# Patient Record
Sex: Male | Born: 1935 | Race: White | Hispanic: No | Marital: Married | Smoking: Former smoker
Health system: Southern US, Community
[De-identification: ages and names within clinical notes are randomized; demographics above are authoritative.]

## PROBLEM LIST (undated history)

## (undated) DIAGNOSIS — I2510 Atherosclerotic heart disease of native coronary artery without angina pectoris: Secondary | ICD-10-CM

## (undated) DIAGNOSIS — E785 Hyperlipidemia, unspecified: Secondary | ICD-10-CM

## (undated) DIAGNOSIS — Z8546 Personal history of malignant neoplasm of prostate: Secondary | ICD-10-CM

## (undated) DIAGNOSIS — I10 Essential (primary) hypertension: Secondary | ICD-10-CM

## (undated) DIAGNOSIS — R1030 Lower abdominal pain, unspecified: Secondary | ICD-10-CM

## (undated) DIAGNOSIS — Z951 Presence of aortocoronary bypass graft: Secondary | ICD-10-CM

## (undated) DIAGNOSIS — C61 Malignant neoplasm of prostate: Secondary | ICD-10-CM

## (undated) HISTORY — PX: RADIOACTIVE SEED IMPLANT: SHX5150

## (undated) HISTORY — PX: SHOULDER SURGERY: SHX246

## (undated) HISTORY — DX: Essential (primary) hypertension: I10

## (undated) HISTORY — DX: Presence of aortocoronary bypass graft: Z95.1

## (undated) HISTORY — DX: Hyperlipidemia, unspecified: E78.5

## (undated) HISTORY — DX: Lower abdominal pain, unspecified: R10.30

## (undated) HISTORY — DX: Personal history of malignant neoplasm of prostate: Z85.46

## (undated) HISTORY — DX: Atherosclerotic heart disease of native coronary artery without angina pectoris: I25.10

---

## 2004-02-29 ENCOUNTER — Ambulatory Visit: Payer: Self-pay | Admitting: Gastroenterology

## 2004-03-13 ENCOUNTER — Ambulatory Visit: Payer: Self-pay | Admitting: Gastroenterology

## 2004-03-29 ENCOUNTER — Ambulatory Visit: Payer: Self-pay | Admitting: Gastroenterology

## 2004-10-04 ENCOUNTER — Encounter (HOSPITAL_COMMUNITY): Admission: RE | Admit: 2004-10-04 | Discharge: 2004-12-26 | Payer: Self-pay | Admitting: Urology

## 2004-10-16 ENCOUNTER — Ambulatory Visit: Admission: RE | Admit: 2004-10-16 | Discharge: 2005-01-14 | Payer: Self-pay | Admitting: Urology

## 2005-01-15 ENCOUNTER — Ambulatory Visit: Admission: RE | Admit: 2005-01-15 | Discharge: 2005-03-14 | Payer: Self-pay | Admitting: Radiation Oncology

## 2005-09-06 ENCOUNTER — Ambulatory Visit (HOSPITAL_COMMUNITY): Admission: RE | Admit: 2005-09-06 | Discharge: 2005-09-07 | Payer: Self-pay | Admitting: Orthopedic Surgery

## 2006-01-11 ENCOUNTER — Ambulatory Visit (HOSPITAL_COMMUNITY): Admission: RE | Admit: 2006-01-11 | Discharge: 2006-01-11 | Payer: Self-pay | Admitting: Orthopedic Surgery

## 2006-03-05 ENCOUNTER — Encounter: Admission: RE | Admit: 2006-03-05 | Discharge: 2006-03-05 | Payer: Self-pay | Admitting: Otolaryngology

## 2006-12-31 ENCOUNTER — Ambulatory Visit: Admission: RE | Admit: 2006-12-31 | Discharge: 2007-01-01 | Payer: Self-pay | Admitting: Radiation Oncology

## 2006-12-31 LAB — PSA: PSA: 0.11 ng/mL (ref 0.10–4.00)

## 2008-01-06 ENCOUNTER — Ambulatory Visit: Admission: RE | Admit: 2008-01-06 | Discharge: 2008-01-06 | Payer: Self-pay | Admitting: Radiation Oncology

## 2008-01-06 LAB — PSA: PSA: 0.05 ng/mL — ABNORMAL LOW (ref 0.10–4.00)

## 2008-07-19 ENCOUNTER — Ambulatory Visit: Admission: RE | Admit: 2008-07-19 | Discharge: 2008-07-19 | Payer: Self-pay | Admitting: Radiation Oncology

## 2008-08-01 DIAGNOSIS — Z951 Presence of aortocoronary bypass graft: Secondary | ICD-10-CM

## 2008-08-01 HISTORY — PX: CORONARY ARTERY BYPASS GRAFT: SHX141

## 2008-08-01 HISTORY — DX: Presence of aortocoronary bypass graft: Z95.1

## 2008-08-06 ENCOUNTER — Encounter: Admission: RE | Admit: 2008-08-06 | Discharge: 2008-08-06 | Payer: Self-pay | Admitting: *Deleted

## 2008-08-10 ENCOUNTER — Ambulatory Visit: Payer: Self-pay | Admitting: Thoracic Surgery (Cardiothoracic Vascular Surgery)

## 2008-08-10 ENCOUNTER — Encounter: Payer: Self-pay | Admitting: Thoracic Surgery (Cardiothoracic Vascular Surgery)

## 2008-08-10 ENCOUNTER — Inpatient Hospital Stay (HOSPITAL_COMMUNITY): Admission: RE | Admit: 2008-08-10 | Discharge: 2008-08-17 | Payer: Self-pay | Admitting: *Deleted

## 2008-08-10 HISTORY — PX: CARDIAC CATHETERIZATION: SHX172

## 2008-09-02 ENCOUNTER — Encounter (HOSPITAL_COMMUNITY): Admission: RE | Admit: 2008-09-02 | Discharge: 2008-12-01 | Payer: Self-pay | Admitting: *Deleted

## 2008-09-13 ENCOUNTER — Encounter
Admission: RE | Admit: 2008-09-13 | Discharge: 2008-09-13 | Payer: Self-pay | Admitting: Thoracic Surgery (Cardiothoracic Vascular Surgery)

## 2008-09-13 ENCOUNTER — Ambulatory Visit: Payer: Self-pay | Admitting: Thoracic Surgery (Cardiothoracic Vascular Surgery)

## 2009-01-17 ENCOUNTER — Ambulatory Visit: Admission: RE | Admit: 2009-01-17 | Discharge: 2009-01-17 | Payer: Self-pay | Admitting: Radiation Oncology

## 2009-01-17 LAB — PSA: PSA: 0.03 ng/mL — ABNORMAL LOW (ref 0.10–4.00)

## 2009-11-22 ENCOUNTER — Ambulatory Visit: Payer: Self-pay | Admitting: Cardiology

## 2010-01-11 LAB — PSA: PSA: 0.03 ng/mL (ref <=4.00)

## 2010-02-03 IMAGING — CR DG CHEST 2V
2 series · 2 of 2 positions shown · non-contrast
Comparison: None

CLINICAL DATA: Pre cardiac catheterization, former smoker

CHEST - 2 VIEW

[w chest pa]
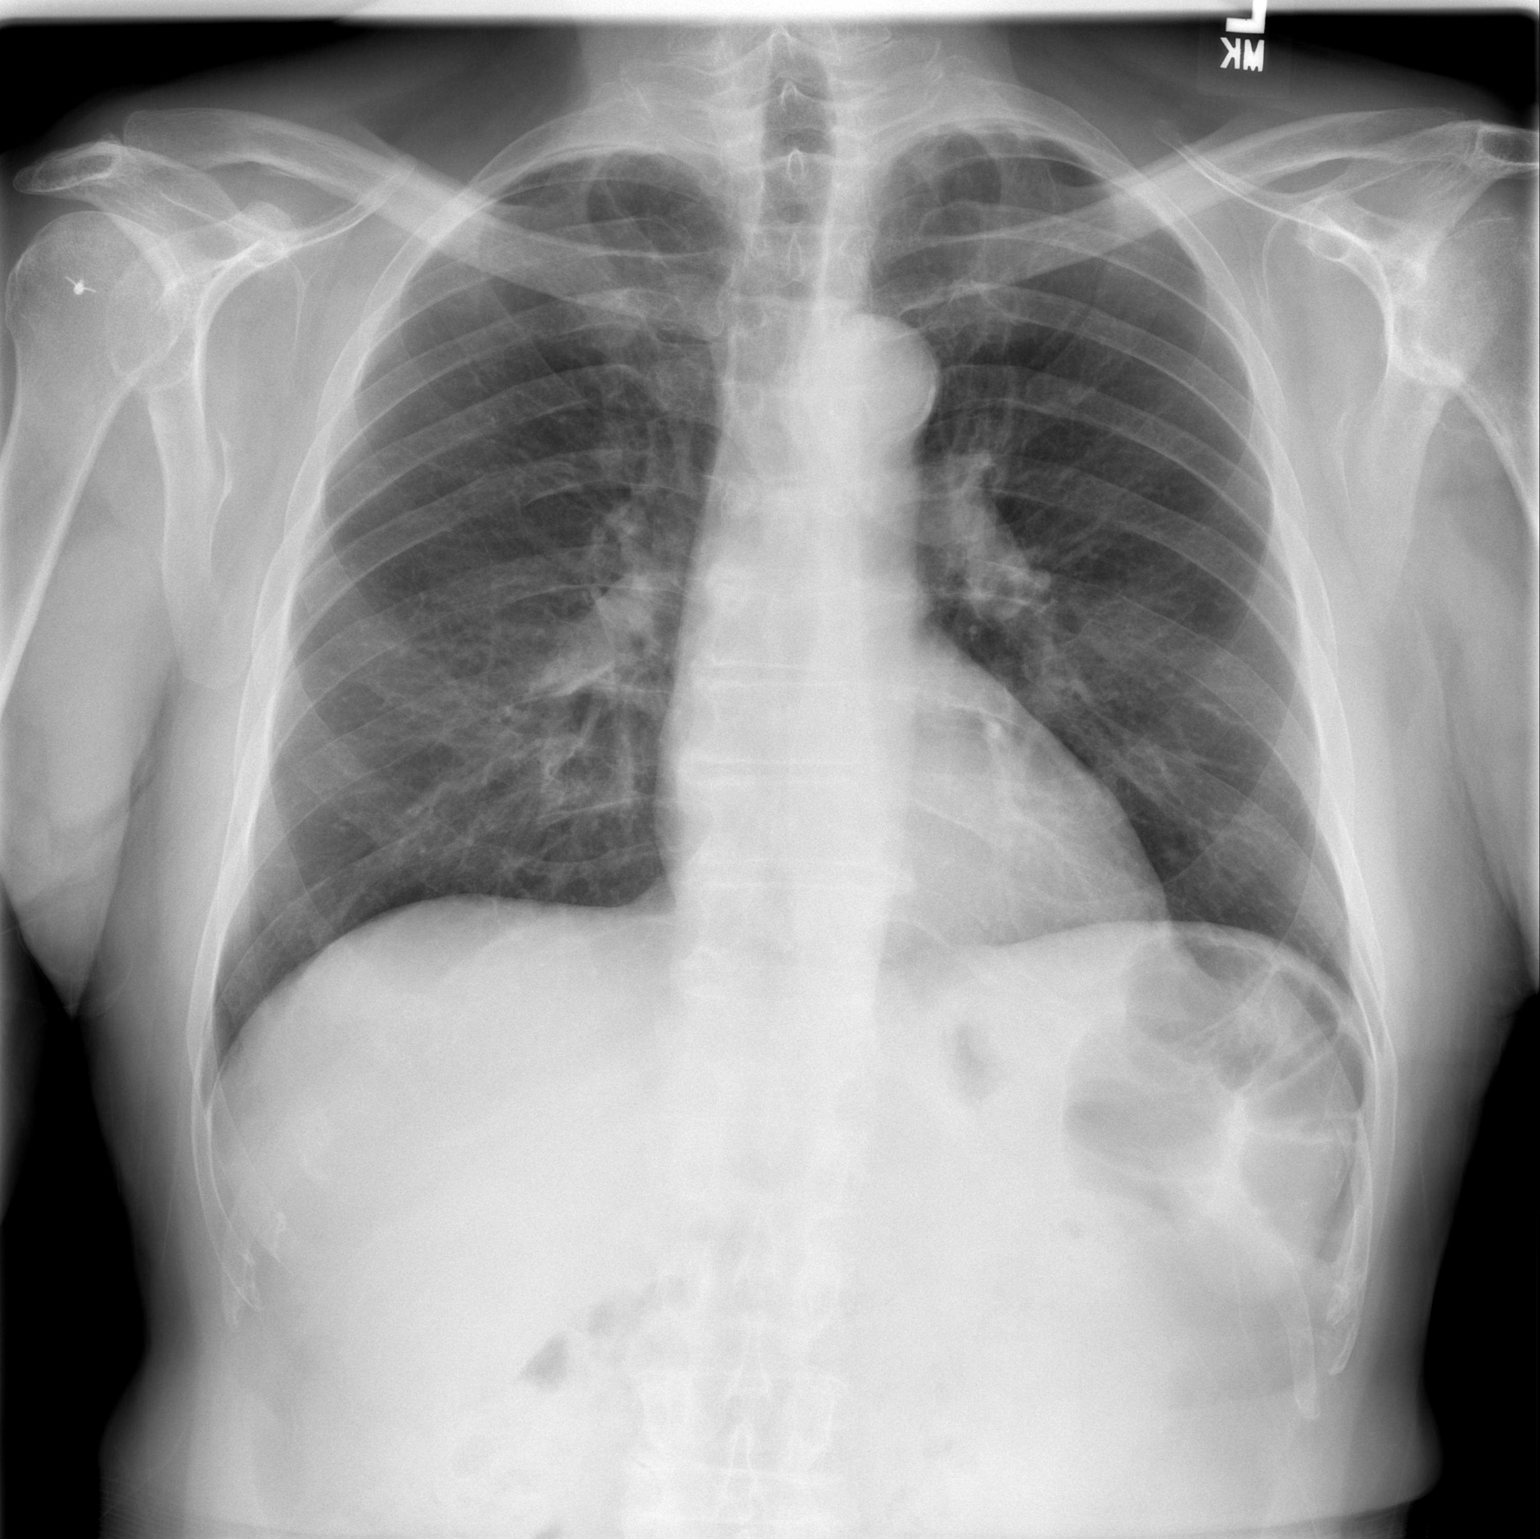

[w chest lat]
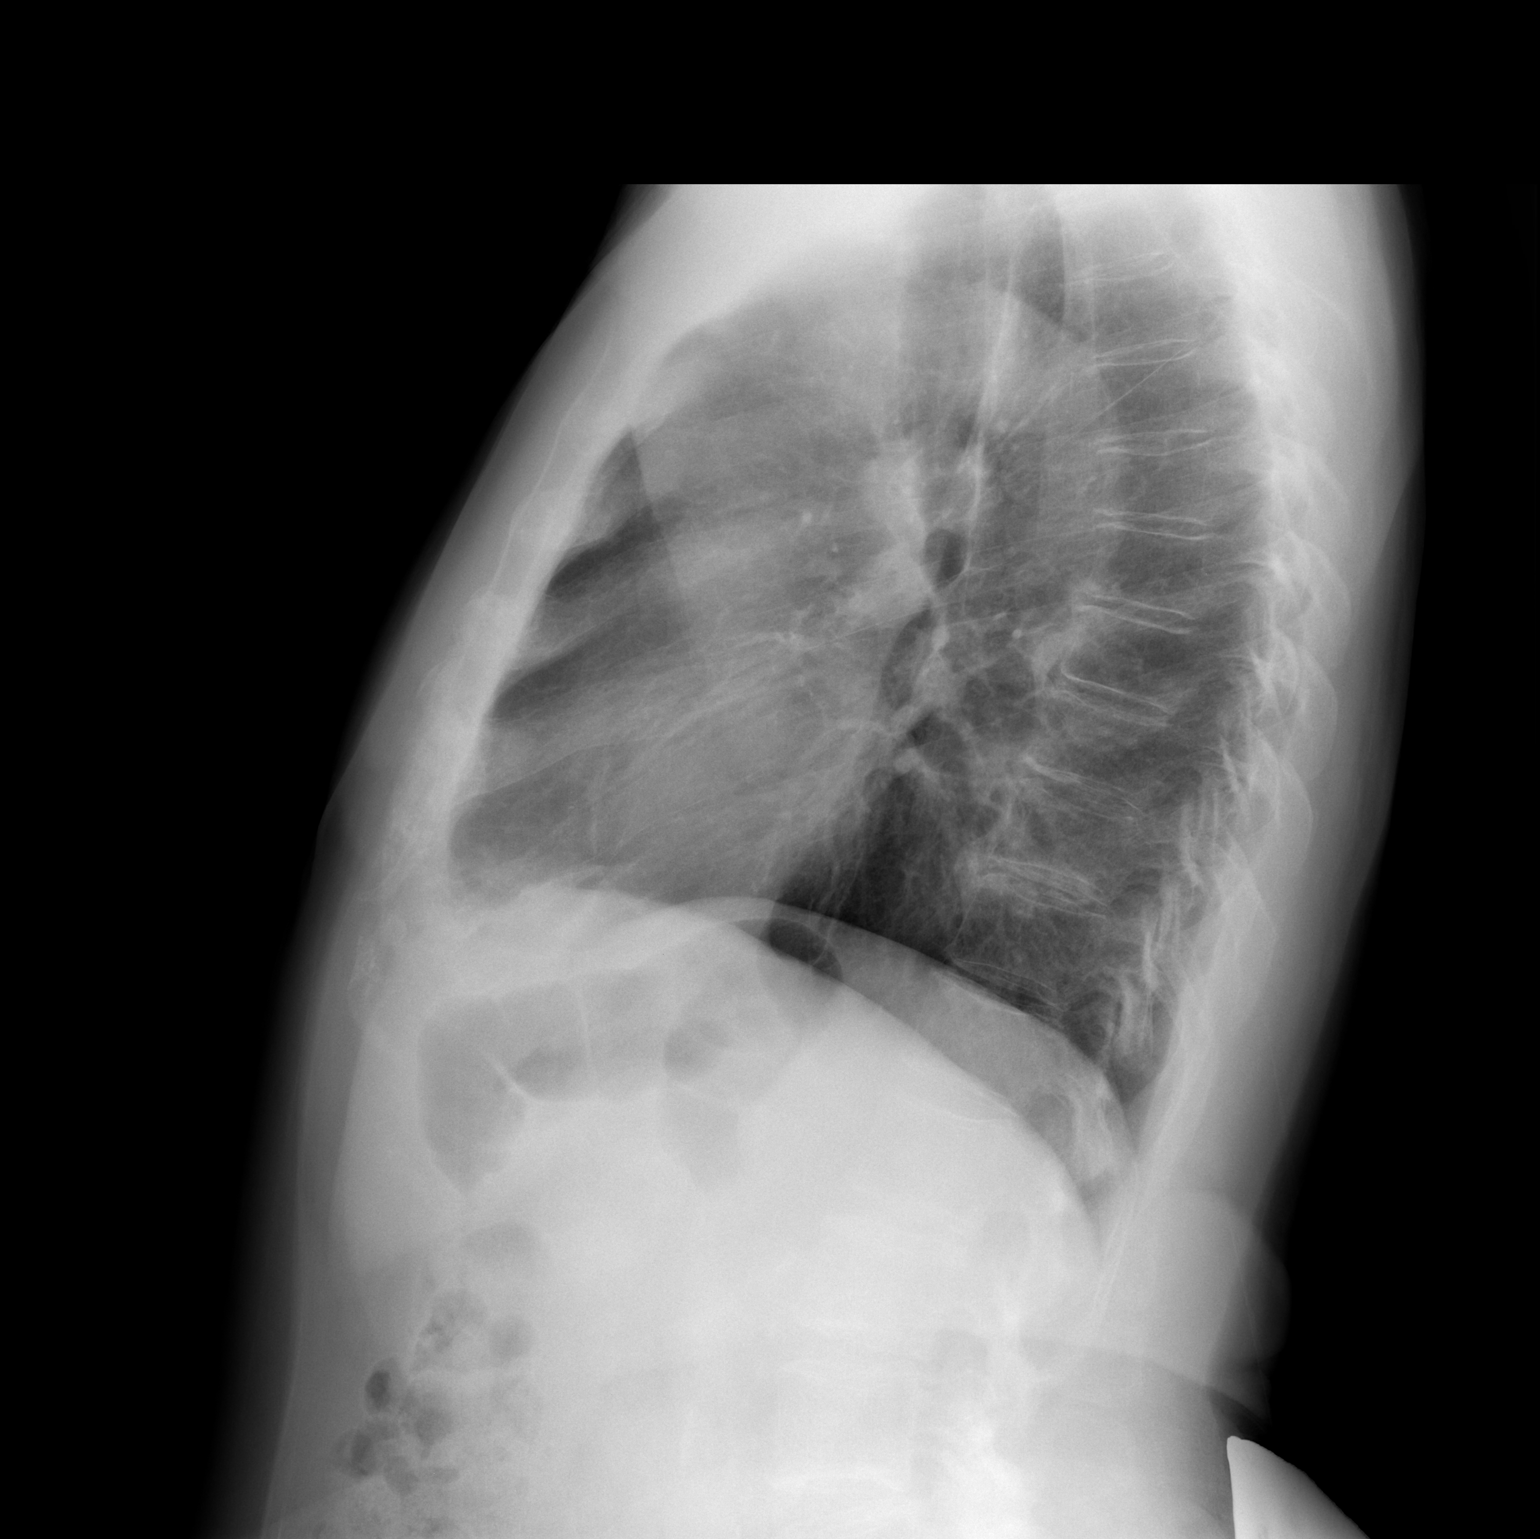

[2 of 2 positions shown; findings below may reference images not displayed]

FINDINGS: The lungs are clear.  The heart is within normal limits
in size.  No acute bony abnormalities seen.  There is mild
degenerative spurring in the mid and lower thoracic spine.
IMPRESSION: No active lung disease.

## 2010-03-13 IMAGING — CR DG CHEST 2V
2 series · 2 of 2 positions shown · non-contrast
Comparison: Chest x-ray of 08/15/2008

CLINICAL DATA: Post CABG, follow-up

CHEST - 2 VIEW

[w chest pa]
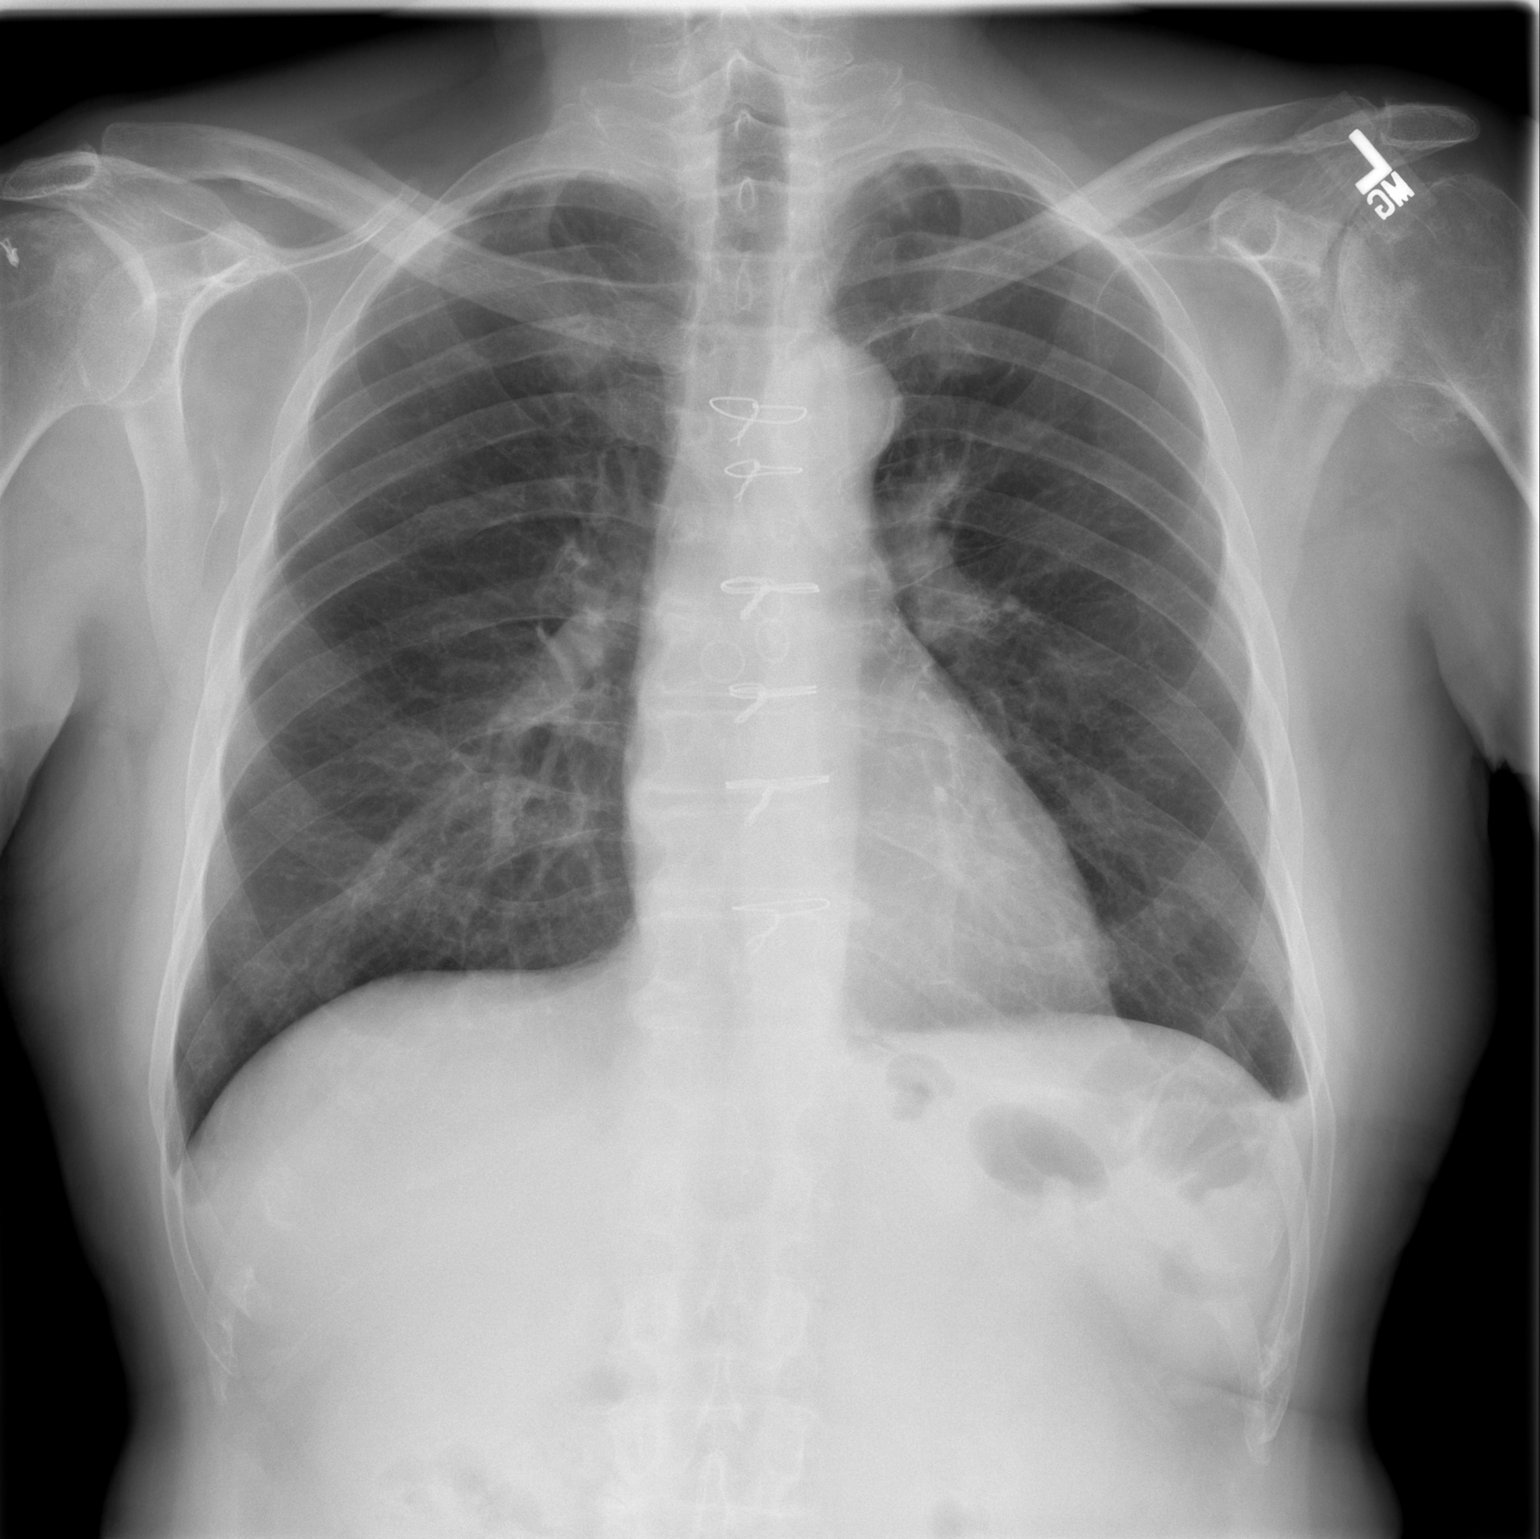

[w chest lat]
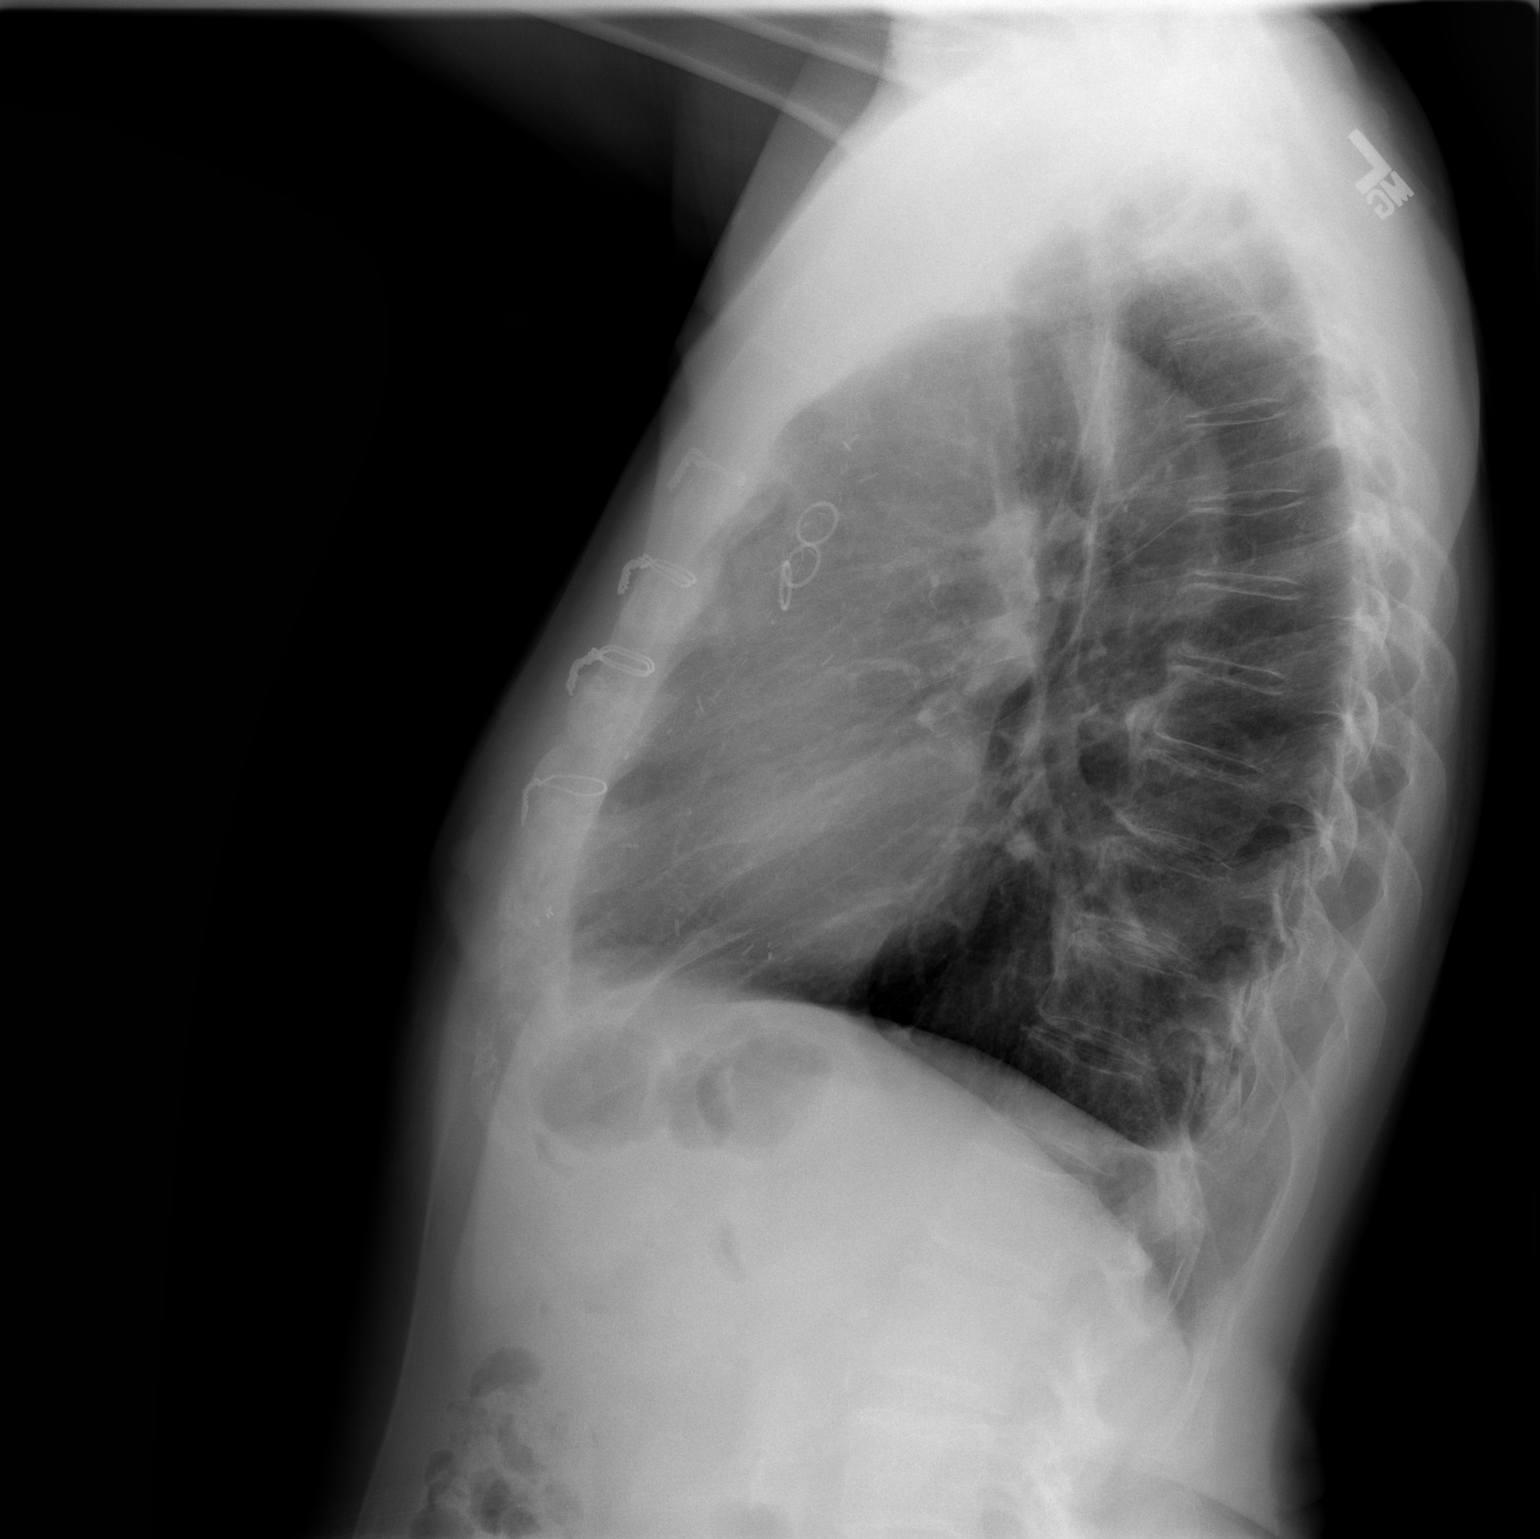

[2 of 2 positions shown; findings below may reference images not displayed]

FINDINGS: Only a small left pleural effusion remains.  Aeration has
improved with resolution of basilar atelectasis.  Heart size is
stable.  Median sternotomy sutures are noted.
IMPRESSION: Only a small left pleural effusion remains.

## 2010-04-08 LAB — CBC
HCT: 27.5 % — ABNORMAL LOW (ref 39.0–52.0)
Hemoglobin: 9.4 g/dL — ABNORMAL LOW (ref 13.0–17.0)
MCHC: 34 g/dL (ref 30.0–36.0)
MCV: 93.9 fL (ref 78.0–100.0)
Platelets: 149 10*3/uL — ABNORMAL LOW (ref 150–400)
RBC: 2.93 MIL/uL — ABNORMAL LOW (ref 4.22–5.81)
RDW: 14.4 % (ref 11.5–15.5)
WBC: 7.3 10*3/uL (ref 4.0–10.5)

## 2010-04-09 LAB — CBC
HCT: 24.6 % — ABNORMAL LOW (ref 39.0–52.0)
HCT: 25.2 % — ABNORMAL LOW (ref 39.0–52.0)
HCT: 25.9 % — ABNORMAL LOW (ref 39.0–52.0)
HCT: 36.7 % — ABNORMAL LOW (ref 39.0–52.0)
HCT: 40.7 % (ref 39.0–52.0)
HCT: 44.8 % (ref 39.0–52.0)
Hemoglobin: 12.6 g/dL — ABNORMAL LOW (ref 13.0–17.0)
Hemoglobin: 14.3 g/dL (ref 13.0–17.0)
Hemoglobin: 15.3 g/dL (ref 13.0–17.0)
Hemoglobin: 8.1 g/dL — ABNORMAL LOW (ref 13.0–17.0)
Hemoglobin: 8.5 g/dL — ABNORMAL LOW (ref 13.0–17.0)
Hemoglobin: 8.7 g/dL — ABNORMAL LOW (ref 13.0–17.0)
Hemoglobin: 9 g/dL — ABNORMAL LOW (ref 13.0–17.0)
Hemoglobin: 9.1 g/dL — ABNORMAL LOW (ref 13.0–17.0)
MCHC: 34.1 g/dL (ref 30.0–36.0)
MCHC: 34.3 g/dL (ref 30.0–36.0)
MCHC: 34.5 g/dL (ref 30.0–36.0)
MCHC: 34.6 g/dL (ref 30.0–36.0)
MCHC: 35 g/dL (ref 30.0–36.0)
MCHC: 35 g/dL (ref 30.0–36.0)
MCHC: 35.1 g/dL (ref 30.0–36.0)
MCHC: 35.6 g/dL (ref 30.0–36.0)
MCV: 90.6 fL (ref 78.0–100.0)
MCV: 91.7 fL (ref 78.0–100.0)
MCV: 91.9 fL (ref 78.0–100.0)
MCV: 92.1 fL (ref 78.0–100.0)
MCV: 92.6 fL (ref 78.0–100.0)
MCV: 93.6 fL (ref 78.0–100.0)
MCV: 94.2 fL (ref 78.0–100.0)
Platelets: 104 10*3/uL — ABNORMAL LOW (ref 150–400)
Platelets: 106 10*3/uL — ABNORMAL LOW (ref 150–400)
Platelets: 113 10*3/uL — ABNORMAL LOW (ref 150–400)
Platelets: 116 10*3/uL — ABNORMAL LOW (ref 150–400)
Platelets: 136 10*3/uL — ABNORMAL LOW (ref 150–400)
Platelets: 65 10*3/uL — ABNORMAL LOW (ref 150–400)
Platelets: 83 10*3/uL — ABNORMAL LOW (ref 150–400)
RBC: 2.39 MIL/uL — ABNORMAL LOW (ref 4.22–5.81)
RBC: 2.49 MIL/uL — ABNORMAL LOW (ref 4.22–5.81)
RBC: 2.66 MIL/uL — ABNORMAL LOW (ref 4.22–5.81)
RBC: 2.75 MIL/uL — ABNORMAL LOW (ref 4.22–5.81)
RBC: 2.81 MIL/uL — ABNORMAL LOW (ref 4.22–5.81)
RBC: 2.83 MIL/uL — ABNORMAL LOW (ref 4.22–5.81)
RBC: 2.93 MIL/uL — ABNORMAL LOW (ref 4.22–5.81)
RBC: 4 MIL/uL — ABNORMAL LOW (ref 4.22–5.81)
RBC: 4.35 MIL/uL (ref 4.22–5.81)
RBC: 4.75 MIL/uL (ref 4.22–5.81)
RDW: 12.9 % (ref 11.5–15.5)
RDW: 13 % (ref 11.5–15.5)
RDW: 13.2 % (ref 11.5–15.5)
RDW: 13.7 % (ref 11.5–15.5)
RDW: 13.8 % (ref 11.5–15.5)
RDW: 14.2 % (ref 11.5–15.5)
RDW: 14.5 % (ref 11.5–15.5)
WBC: 11.1 10*3/uL — ABNORMAL HIGH (ref 4.0–10.5)
WBC: 11.4 10*3/uL — ABNORMAL HIGH (ref 4.0–10.5)
WBC: 13.5 10*3/uL — ABNORMAL HIGH (ref 4.0–10.5)
WBC: 5.8 10*3/uL (ref 4.0–10.5)
WBC: 6.9 10*3/uL (ref 4.0–10.5)
WBC: 7.9 10*3/uL (ref 4.0–10.5)
WBC: 8.1 10*3/uL (ref 4.0–10.5)

## 2010-04-09 LAB — POCT I-STAT 4, (NA,K, GLUC, HGB,HCT)
Glucose, Bld: 100 mg/dL — ABNORMAL HIGH (ref 70–99)
Glucose, Bld: 101 mg/dL — ABNORMAL HIGH (ref 70–99)
Glucose, Bld: 111 mg/dL — ABNORMAL HIGH (ref 70–99)
Glucose, Bld: 112 mg/dL — ABNORMAL HIGH (ref 70–99)
Glucose, Bld: 180 mg/dL — ABNORMAL HIGH (ref 70–99)
Glucose, Bld: 183 mg/dL — ABNORMAL HIGH (ref 70–99)
Glucose, Bld: 193 mg/dL — ABNORMAL HIGH (ref 70–99)
Glucose, Bld: 243 mg/dL — ABNORMAL HIGH (ref 70–99)
Glucose, Bld: 252 mg/dL — ABNORMAL HIGH (ref 70–99)
Glucose, Bld: 96 mg/dL (ref 70–99)
HCT: 18 % — ABNORMAL LOW (ref 39.0–52.0)
HCT: 20 % — ABNORMAL LOW (ref 39.0–52.0)
HCT: 23 % — ABNORMAL LOW (ref 39.0–52.0)
HCT: 23 % — ABNORMAL LOW (ref 39.0–52.0)
HCT: 24 % — ABNORMAL LOW (ref 39.0–52.0)
HCT: 25 % — ABNORMAL LOW (ref 39.0–52.0)
HCT: 25 % — ABNORMAL LOW (ref 39.0–52.0)
HCT: 26 % — ABNORMAL LOW (ref 39.0–52.0)
HCT: 28 % — ABNORMAL LOW (ref 39.0–52.0)
HCT: 31 % — ABNORMAL LOW (ref 39.0–52.0)
Hemoglobin: 10.5 g/dL — ABNORMAL LOW (ref 13.0–17.0)
Hemoglobin: 12.2 g/dL — ABNORMAL LOW (ref 13.0–17.0)
Hemoglobin: 13.3 g/dL (ref 13.0–17.0)
Hemoglobin: 6.1 g/dL — CL (ref 13.0–17.0)
Hemoglobin: 6.8 g/dL — CL (ref 13.0–17.0)
Hemoglobin: 7.8 g/dL — CL (ref 13.0–17.0)
Hemoglobin: 7.8 g/dL — CL (ref 13.0–17.0)
Hemoglobin: 8.2 g/dL — ABNORMAL LOW (ref 13.0–17.0)
Hemoglobin: 8.5 g/dL — ABNORMAL LOW (ref 13.0–17.0)
Hemoglobin: 8.5 g/dL — ABNORMAL LOW (ref 13.0–17.0)
Hemoglobin: 8.8 g/dL — ABNORMAL LOW (ref 13.0–17.0)
Hemoglobin: 9.5 g/dL — ABNORMAL LOW (ref 13.0–17.0)
Potassium: 2.1 meq/L — CL (ref 3.5–5.1)
Potassium: 2.2 meq/L — CL (ref 3.5–5.1)
Potassium: 3.4 mEq/L — ABNORMAL LOW (ref 3.5–5.1)
Potassium: 3.5 meq/L (ref 3.5–5.1)
Potassium: 3.7 mEq/L (ref 3.5–5.1)
Potassium: 3.9 mEq/L (ref 3.5–5.1)
Potassium: 4.1 mEq/L (ref 3.5–5.1)
Potassium: 4.1 meq/L (ref 3.5–5.1)
Potassium: 4.3 meq/L (ref 3.5–5.1)
Potassium: 4.7 meq/L (ref 3.5–5.1)
Potassium: 5.6 mEq/L — ABNORMAL HIGH (ref 3.5–5.1)
Sodium: 134 mEq/L — ABNORMAL LOW (ref 135–145)
Sodium: 136 mEq/L (ref 135–145)
Sodium: 136 meq/L (ref 135–145)
Sodium: 138 mEq/L (ref 135–145)
Sodium: 139 mEq/L (ref 135–145)
Sodium: 139 meq/L (ref 135–145)
Sodium: 140 mEq/L (ref 135–145)
Sodium: 141 meq/L (ref 135–145)
Sodium: 141 meq/L (ref 135–145)
Sodium: 143 mEq/L (ref 135–145)
Sodium: 143 meq/L (ref 135–145)
Sodium: 145 meq/L (ref 135–145)

## 2010-04-09 LAB — POCT I-STAT 3, ART BLOOD GAS (G3+)
Acid-Base Excess: 1 mmol/L (ref 0.0–2.0)
Acid-base deficit: 1 mmol/L (ref 0.0–2.0)
Acid-base deficit: 1 mmol/L (ref 0.0–2.0)
Acid-base deficit: 1 mmol/L (ref 0.0–2.0)
Acid-base deficit: 1 mmol/L (ref 0.0–2.0)
Acid-base deficit: 2 mmol/L (ref 0.0–2.0)
Acid-base deficit: 2 mmol/L (ref 0.0–2.0)
Acid-base deficit: 4 mmol/L — ABNORMAL HIGH (ref 0.0–2.0)
Acid-base deficit: 6 mmol/L — ABNORMAL HIGH (ref 0.0–2.0)
Acid-base deficit: 7 mmol/L — ABNORMAL HIGH (ref 0.0–2.0)
Bicarbonate: 18.9 meq/L — ABNORMAL LOW (ref 20.0–24.0)
Bicarbonate: 20.3 meq/L (ref 20.0–24.0)
Bicarbonate: 21.8 mEq/L (ref 20.0–24.0)
Bicarbonate: 22.1 meq/L (ref 20.0–24.0)
Bicarbonate: 23 mEq/L (ref 20.0–24.0)
Bicarbonate: 23.3 meq/L (ref 20.0–24.0)
Bicarbonate: 24.1 meq/L — ABNORMAL HIGH (ref 20.0–24.0)
Bicarbonate: 25.1 meq/L — ABNORMAL HIGH (ref 20.0–24.0)
O2 Saturation: 100 %
O2 Saturation: 100 %
O2 Saturation: 100 %
O2 Saturation: 100 %
O2 Saturation: 92 %
O2 Saturation: 97 %
O2 Saturation: 97 %
Patient temperature: 34
Patient temperature: 34.9
Patient temperature: 35.7
Patient temperature: 37.1
TCO2: 20 mmol/L (ref 0–100)
TCO2: 21 mmol/L (ref 0–100)
TCO2: 23 mmol/L (ref 0–100)
TCO2: 24 mmol/L (ref 0–100)
TCO2: 25 mmol/L (ref 0–100)
TCO2: 26 mmol/L (ref 0–100)
TCO2: 26 mmol/L (ref 0–100)
pCO2 arterial: 28.9 mmHg — ABNORMAL LOW (ref 35.0–45.0)
pCO2 arterial: 32.8 mmHg — ABNORMAL LOW (ref 35.0–45.0)
pCO2 arterial: 35 mmHg (ref 35.0–45.0)
pCO2 arterial: 35 mmHg (ref 35.0–45.0)
pCO2 arterial: 38.1 mmHg (ref 35.0–45.0)
pCO2 arterial: 39.6 mmHg (ref 35.0–45.0)
pCO2 arterial: 39.7 mmHg (ref 35.0–45.0)
pH, Arterial: 7.285 — ABNORMAL LOW (ref 7.350–7.450)
pH, Arterial: 7.37 (ref 7.350–7.450)
pH, Arterial: 7.393 (ref 7.350–7.450)
pH, Arterial: 7.393 (ref 7.350–7.450)
pH, Arterial: 7.404 (ref 7.350–7.450)
pH, Arterial: 7.431 (ref 7.350–7.450)
pH, Arterial: 7.464 — ABNORMAL HIGH (ref 7.350–7.450)
pH, Arterial: 7.482 — ABNORMAL HIGH (ref 7.350–7.450)
pO2, Arterial: 191 mmHg — ABNORMAL HIGH (ref 80.0–100.0)
pO2, Arterial: 275 mmHg — ABNORMAL HIGH (ref 80.0–100.0)
pO2, Arterial: 286 mmHg — ABNORMAL HIGH (ref 80.0–100.0)
pO2, Arterial: 328 mmHg — ABNORMAL HIGH (ref 80.0–100.0)
pO2, Arterial: 72 mmHg — ABNORMAL LOW (ref 80.0–100.0)
pO2, Arterial: 73 mmHg — ABNORMAL LOW (ref 80.0–100.0)
pO2, Arterial: 78 mmHg — ABNORMAL LOW (ref 80.0–100.0)
pO2, Arterial: 81 mmHg (ref 80.0–100.0)

## 2010-04-09 LAB — HEMOGLOBIN AND HEMATOCRIT, BLOOD
HCT: 20.5 % — ABNORMAL LOW (ref 39.0–52.0)
HCT: 25.8 % — ABNORMAL LOW (ref 39.0–52.0)
Hemoglobin: 9.1 g/dL — ABNORMAL LOW (ref 13.0–17.0)

## 2010-04-09 LAB — COMPREHENSIVE METABOLIC PANEL
ALT: 26 U/L (ref 0–53)
AST: 27 U/L (ref 0–37)
Albumin: 4.2 g/dL (ref 3.5–5.2)
Alkaline Phosphatase: 48 U/L (ref 39–117)
BUN: 15 mg/dL (ref 6–23)
CO2: 34 mEq/L — ABNORMAL HIGH (ref 19–32)
Calcium: 9.7 mg/dL (ref 8.4–10.5)
Chloride: 101 mEq/L (ref 96–112)
Creatinine, Ser: 0.9 mg/dL (ref 0.4–1.5)
GFR calc Af Amer: >60 mL/min (ref >60)
GFR calc non Af Amer: >60 mL/min (ref >60)
Glucose, Bld: 120 mg/dL — ABNORMAL HIGH (ref 70–99)
Potassium: 3.9 mEq/L (ref 3.5–5.1)
Sodium: 141 mEq/L (ref 135–145)
Total Bilirubin: 1.2 mg/dL (ref 0.3–1.2)
Total Protein: 7.2 g/dL (ref 6.0–8.3)

## 2010-04-09 LAB — BLOOD GAS, ARTERIAL
Acid-Base Excess: 5 mmol/L — ABNORMAL HIGH (ref 0.0–2.0)
Bicarbonate: 28.9 mEq/L — ABNORMAL HIGH (ref 20.0–24.0)
Drawn by: 297571
FIO2: 0.21 %
O2 Saturation: 93.8 %
Patient temperature: 98.6
TCO2: 30.2 mmol/L (ref 0–100)
pCO2 arterial: 41.7 mmHg (ref 35.0–45.0)
pH, Arterial: 7.455 — ABNORMAL HIGH (ref 7.350–7.450)
pO2, Arterial: 67.4 mmHg — ABNORMAL LOW (ref 80.0–100.0)

## 2010-04-09 LAB — MAGNESIUM
Magnesium: 2.4 mg/dL (ref 1.5–2.5)
Magnesium: 2.6 mg/dL — ABNORMAL HIGH (ref 1.5–2.5)

## 2010-04-09 LAB — BASIC METABOLIC PANEL
BUN: 14 mg/dL (ref 6–23)
BUN: 20 mg/dL (ref 6–23)
CO2: 26 mEq/L (ref 19–32)
CO2: 30 mEq/L (ref 19–32)
CO2: 30 mEq/L (ref 19–32)
Calcium: 8.1 mg/dL — ABNORMAL LOW (ref 8.4–10.5)
Calcium: 8.2 mg/dL — ABNORMAL LOW (ref 8.4–10.5)
Calcium: 9 mg/dL (ref 8.4–10.5)
Chloride: 101 mEq/L (ref 96–112)
Chloride: 108 mEq/L (ref 96–112)
Creatinine, Ser: 0.91 mg/dL (ref 0.4–1.5)
Creatinine, Ser: 0.97 mg/dL (ref 0.4–1.5)
Creatinine, Ser: 1.05 mg/dL (ref 0.4–1.5)
Creatinine, Ser: 1.05 mg/dL (ref 0.4–1.5)
GFR calc Af Amer: >60 mL/min (ref >60)
GFR calc Af Amer: >60 mL/min (ref >60)
GFR calc Af Amer: >60 mL/min (ref >60)
GFR calc Af Amer: >60 mL/min (ref >60)
GFR calc non Af Amer: >60 mL/min (ref >60)
GFR calc non Af Amer: >60 mL/min (ref >60)
Glucose, Bld: 102 mg/dL — ABNORMAL HIGH (ref 70–99)
Potassium: 3.4 mEq/L — ABNORMAL LOW (ref 3.5–5.1)
Sodium: 137 mEq/L (ref 135–145)
Sodium: 138 mEq/L (ref 135–145)

## 2010-04-09 LAB — PREPARE PLATELETS

## 2010-04-09 LAB — POCT I-STAT 3, VENOUS BLOOD GAS (G3P V)
O2 Saturation: 84 %
TCO2: 24 mmol/L (ref 0–100)
pCO2, Ven: 45 mmHg (ref 45.0–50.0)
pH, Ven: 7.317 — ABNORMAL HIGH (ref 7.250–7.300)

## 2010-04-09 LAB — CREATININE, SERUM
Creatinine, Ser: 1.15 mg/dL (ref 0.4–1.5)
GFR calc non Af Amer: >60 mL/min

## 2010-04-09 LAB — POCT I-STAT, CHEM 8
BUN: 13 mg/dL (ref 6–23)
Calcium, Ion: 1.15 mmol/L (ref 1.12–1.32)
Chloride: 107 meq/L (ref 96–112)
Creatinine, Ser: 1.2 mg/dL (ref 0.4–1.5)
Glucose, Bld: 153 mg/dL — ABNORMAL HIGH (ref 70–99)
HCT: 24 % — ABNORMAL LOW (ref 39.0–52.0)
Hemoglobin: 8.2 g/dL — ABNORMAL LOW (ref 13.0–17.0)
Potassium: 4.8 meq/L (ref 3.5–5.1)
Sodium: 143 meq/L (ref 135–145)
TCO2: 24 mmol/L (ref 0–100)

## 2010-04-09 LAB — GLUCOSE, CAPILLARY
Glucose-Capillary: 102 mg/dL — ABNORMAL HIGH (ref 70–99)
Glucose-Capillary: 106 mg/dL — ABNORMAL HIGH (ref 70–99)
Glucose-Capillary: 108 mg/dL — ABNORMAL HIGH (ref 70–99)
Glucose-Capillary: 118 mg/dL — ABNORMAL HIGH (ref 70–99)
Glucose-Capillary: 132 mg/dL — ABNORMAL HIGH (ref 70–99)
Glucose-Capillary: 133 mg/dL — ABNORMAL HIGH (ref 70–99)
Glucose-Capillary: 138 mg/dL — ABNORMAL HIGH (ref 70–99)
Glucose-Capillary: 140 mg/dL — ABNORMAL HIGH (ref 70–99)
Glucose-Capillary: 141 mg/dL — ABNORMAL HIGH (ref 70–99)
Glucose-Capillary: 151 mg/dL — ABNORMAL HIGH (ref 70–99)
Glucose-Capillary: 157 mg/dL — ABNORMAL HIGH (ref 70–99)
Glucose-Capillary: 158 mg/dL — ABNORMAL HIGH (ref 70–99)
Glucose-Capillary: 159 mg/dL — ABNORMAL HIGH (ref 70–99)
Glucose-Capillary: 93 mg/dL (ref 70–99)
Glucose-Capillary: 95 mg/dL (ref 70–99)

## 2010-04-09 LAB — PREPARE FRESH FROZEN PLASMA

## 2010-04-09 LAB — CROSSMATCH
ABO/RH(D): A NEG
ABO/RH(D): A NEG
Antibody Screen: NEGATIVE

## 2010-04-09 LAB — URINALYSIS, ROUTINE W REFLEX MICROSCOPIC
Bilirubin Urine: NEGATIVE
Glucose, UA: NEGATIVE mg/dL
Hgb urine dipstick: NEGATIVE
Ketones, ur: NEGATIVE mg/dL
Nitrite: NEGATIVE
Protein, ur: NEGATIVE mg/dL
Specific Gravity, Urine: 1.02 (ref 1.005–1.030)
Urobilinogen, UA: 0.2 mg/dL (ref 0.0–1.0)
pH: 7.5 (ref 5.0–8.0)

## 2010-04-09 LAB — BASIC METABOLIC PANEL WITH GFR
BUN: 16 mg/dL (ref 6–23)
CO2: 26 meq/L (ref 19–32)
Calcium: 8.2 mg/dL — ABNORMAL LOW (ref 8.4–10.5)
Chloride: 105 meq/L (ref 96–112)
Creatinine, Ser: 1.19 mg/dL (ref 0.4–1.5)
GFR calc non Af Amer: 59 mL/min — ABNORMAL LOW
Glucose, Bld: 141 mg/dL — ABNORMAL HIGH (ref 70–99)
Potassium: 4.3 meq/L (ref 3.5–5.1)
Sodium: 140 meq/L (ref 135–145)

## 2010-04-09 LAB — POCT I-STAT GLUCOSE
Glucose, Bld: 112 mg/dL — ABNORMAL HIGH (ref 70–99)
Operator id: 173791

## 2010-04-09 LAB — PROTIME-INR
INR: 1 (ref 0.00–1.49)
INR: 1.6 — ABNORMAL HIGH (ref 0.00–1.49)
INR: 2.4 — ABNORMAL HIGH (ref 0.00–1.49)
Prothrombin Time: 13 seconds (ref 11.6–15.2)
Prothrombin Time: 26 seconds — ABNORMAL HIGH (ref 11.6–15.2)

## 2010-04-09 LAB — LIPID PANEL
Cholesterol: 142 mg/dL (ref 0–200)
HDL: 44 mg/dL (ref >39)
LDL Cholesterol: 80 mg/dL (ref 0–99)
Total CHOL/HDL Ratio: 3.2 RATIO
Triglycerides: 92 mg/dL (ref <150)
VLDL: 18 mg/dL (ref 0–40)

## 2010-04-09 LAB — APTT
aPTT: 27 seconds (ref 24–37)
aPTT: 33 s (ref 24–37)
aPTT: 35 s (ref 24–37)

## 2010-04-09 LAB — PLATELET COUNT
Platelets: 110 10*3/uL — ABNORMAL LOW (ref 150–400)
Platelets: 89 10*3/uL — ABNORMAL LOW (ref 150–400)

## 2010-04-09 LAB — ABO/RH: ABO/RH(D): A NEG

## 2010-04-09 LAB — HEPARIN LEVEL (UNFRACTIONATED): Heparin Unfractionated: 0.6 IU/mL (ref 0.30–0.70)

## 2010-05-16 NOTE — H&P (Signed)
NAMECORKY, [name]               ACCOUNT NO.:  [id number]   MEDICAL RECORD NO.:  [id number]          PATIENT TYPE:  OIB   LOCATION:                               FACILITY:  [facility]   PHYSICIAN:  Elmore Guise., M.D.DATE OF BIRTH:  [birth year]   DATE OF ADMISSION:  08/10/2008  DATE OF DISCHARGE:                              HISTORY & PHYSICAL   INDICATION:  Chest pain.  The patient for elective catheterization with  possible intervention.   PRIMARY CARE PHYSICIAN:  Jeremy R. Collins Scotland, MD   HISTORY OF PRESENT ILLNESS:  [name] is a very pleasant 77 year old  white male with past medical history of hypertension, prostate cancer  (treated with seed implant/radiation) who presents for evaluation of  chest discomfort.  The patient reports his symptoms initially starting 6  weeks ago.  Initially, he would start with chest discomfort only when he  was very active.  He describes the pain is retrosternal.  It would stop  when his activities were finished.  It would resolve very quickly.  He  went on vacation 3 weeks ago while there he had to walk up and incline.  Every time he would walk up this incline, he would have chest heaviness.  This was associated with some dyspnea.  Once he got to the top, he would  stop and rest and his symptoms would resolve.  Over the last 2 weeks,  now with any type of moderate activity, he would have some chest  tightness.  This week, he went to push his garbage to road and on  walking back to the house he again had some chest discomfort.  It is a  retrosternal heaviness associated with shortness of breath.  He had a  similar spell on Wednesday.  Both spells tend to last somewhere between  5 and 10 minutes and resolve on resting.  He denies any problems with  nausea or diaphoresis.  No palpitations.  His weight is stable for him.  He denies any problems with bleeding or easy bruising.  He has had no  new medications.  No recent fever, chills, nausea,  vomiting, or  diarrhea.   REVIEW OF SYMPTOMS:  He does complete a review of systems sheet today  and review of systems are as per HPI, all others are negative.   CURRENT MEDICATIONS:  1. Aspirin 81 mg daily.  2. Calcium, potassium, and zinc daily.  3. HCTZ 25 mg daily.  4. Selenium 50 mcg daily.  5. Toprol-XL 50 mg daily.   ALLERGIES:  None.   FAMILY HISTORY:  Positive for heart failure with his mother who died at  age 41.  His brother started having trouble with heart disease in his  mid 10s, he underwent four-vessel bypass.  His brother died at age 36  also from heart failure.   SOCIAL HISTORY:  The patient is married.  He is retired.  He does try to  stay very active.  He has remote history of tobacco use, quit over 40  years ago.  He drinks 1-2 cocktails per night and 3 cups  of coffee every  morning.   PAST SURGICAL HISTORY:  Shoulder surgery 2 years ago.  He has also had  seed implant for prostate cancer 3 years ago.   PHYSICAL EXAMINATION:  VITAL SIGNS:  His weight is 165.  Blood pressure  124/80.  Heart rate is 56 and regular.  GENERAL:  He is a very pleasant white male, alert and oriented x4, in no  acute distress.  HEENT:  Normal.  NECK:  Supple.  No lymphadenopathy, no JVD, no bruits.  LUNGS:  Clear.  HEART:  Regular with normal S1 and S2.  Soft flow murmur noted.  ABDOMEN:  Soft, nontender, and nondistended.  No rebound or guarding.  No hepatosplenomegaly.  No abdominal bruits.  Femoral pulses are 2+,  left greater than right.  Pedal pulses are 2+.  No bruits noted.  EXTREMITIES:  No edema.  NEUROLOGICAL:  No focal deficits.  SKIN:  Warm and dry.   EKG done on August 05, 2008, shows sinus bradycardia, rate of 54 per  minute, normal axis, normal intervals with no significant ST-T wave  changes.   IMPRESSION:  1. Worsening exertional chest pain consistent with angina.  2. Family history of heart disease with his brother and mother.  3. Unknown lipid status.   4. History of hypertension.   PLAN:  The patient is on aspirin and Toprol.  We will continue those.  He does have nitroglycerin spray to use on a p.r.n. basis.  I will ask  him to start Lipitor 40 mg once daily and we will set him up for  catheterization with possible intervention.  If his symptoms worsen  prior to his catheterization, he is to give [place] a call.  If he has no  relief with nitroglycerin, he is to call 911 and go to the hospital.  I  discussed risks and benefits of cardiac catheterization with possible  intervention with him at length.  He agrees to proceed.  He will have  blood work and chest x-ray done today.  His procedure will be scheduled  for next Tuesday.      Elmore Guise., M.D.  Electronically Signed     TWK/MEDQ  D:  08/06/2008  T:  08/07/2008  Job:  [id number]   cc:   [name], M.D.

## 2010-05-16 NOTE — Op Note (Signed)
NAMEMORY, HERRMAN               ACCOUNT NO.:  [id number]   MEDICAL RECORD NO.:  [id number]          PATIENT TYPE:  INP   LOCATION:  2308                         FACILITY:  [facility]   PHYSICIAN:  Bedelia Person, M.D.        DATE OF BIRTH:  14-Jan-1930   DATE OF PROCEDURE:  08/11/2008  DATE OF DISCHARGE:                               OPERATIVE REPORT   Mr. Napoli earlier today underwent coronary artery bypass grafting  uneventfully in the intensive care unit.  The patient had an episode of  ventricular fibrillation and was brought back to the operating room in  marginally stable condition for evaluation of his coronary artery bypass  grafts.  The TEE will be used intraoperatively to assess left  ventricular function as well as valvular function.   The patient was already intubated and transesophageal transducer was  heavily lubricated, placed in a sleeve which was then lubricated and  placed into the oropharynx and down the esophagus without difficulty.  The examination revealed the left ventricle showed significant inferior-  posterior wall akinesis.  This was suspected to be new.  Also, there was  severe right ventricular hypokinesis with the septum bulging into the  left ventricle.  Pulmonary artery diastolic pressures were in the mid  20s at this time.  The patient was placed on cardiopulmonary bypass and  underwent a revision of the right coronary artery bypass graft at the  completion of the bypass.  The patient was on inotropic dopamine  support, which showed a dynamic contractility of the entire left  ventricle.  No segmental defects were noted.  There was complete  resolution of the akinesis of the inferior and posterior walls.  The  right ventricle was now contracting well and there was a normal septal  contractility with no bulging.  The remainder of the heart exam revealed  the mitral valve that has trace mitral insufficiency and aortic valve to  have 3 leaflets with normal  opening and closing.  No calcium noted and  no aortic insufficiency.  There were no other notable abnormalities on  the examination.           ______________________________  Bedelia Person, M.D.     LK/MEDQ  D:  08/11/2008  T:  08/12/2008  Job:  [id number]

## 2010-05-16 NOTE — Assessment & Plan Note (Signed)
OFFICE VISIT   [name], [name]  DOB:  07/24/1930                                        September 13, 2008  CHART #:  [id number]   REASON FOR VISIT:  Followup after recent surgery.   HISTORY:  The patient is a 77 year old gentleman who underwent coronary  artery bypass grafting x5 for three-vessel disease back on August 11, 2008.  In the ICU medially following surgery, he had a sudden V-fib  arrest requiring defibrillation CPR.  He was taken back to the emergency  room and found to have kinking of the vein graft to the posterior  descending and distal right coronary.  There was a kink between the 2  anastomoses and this was revised.  He subsequently did extremely well  after that and now returns for 66-month followup visit.  The patient  states that he is having minimal discomfort.  He was having a lot of  shoulder pain from previous shoulder injury in the early postoperative  period, was taking pain medication for that.  He has never had enough  pain from his sternotomy to require pain medication.  He has not had any  anginal-type symptoms.  He has not had any shortness of breath.  He is  walking on a daily basis and getting ready to start cardiac rehab.  He  does complain of some left flank pain with a deep inspiration, it has  been present for about 10 days and has been getting better over the past  day or two.   CURRENT MEDICATIONS:  Lasix 40 mg daily, potassium 20 mEq daily,  metoprolol 25 mg daily, Lipitor 40 mg daily, aspirin 81 mg daily.   PHYSICAL EXAMINATION:  General:  The patient is a 77 year old gentleman  in no acute distress.  Vital Signs:  His blood pressure is 114/72, pulse  76, respirations are 18, his ox saturation is 97% on room air.  Lungs:  Clear with equal breath sounds.  Cardiac:  Regular rate and rhythm.  Normal S1 and S2.  No murmurs, rubs, or gallops.  Sternal incision is  clean, dry, and intact.  Sternum is stable.  Leg  incision is healing  well.  There is no peripheral edema.   IMAGING:  Chest x-ray shows trace left pleural effusion.   IMPRESSION:  The patient is a 77 year old gentleman.  He is status post  coronary bypass grafting and required emergency surgery for revision of  sequential vein graft and has done remarkably well since then.  In fact,  he is well ahead of curve on most people who have had coronary bypass  grafting.  His exercise tolerance is good.  His discomfort is minimal.  He feels good and is anxious to increase his activities.  I told him he  may begin driving, appropriate precautions were discussed, but since he  is no longer taking narcotics as long as he is careful, he should be  fine to drive.  He is not to lift any obvious weight greater than 10  pounds or engage in any heavy work with his arms for at least another 2  weeks after that.  He can gradually start increasing his upper body use.  He was encouraged to go ahead and start cardiac rehab.  No medication  changes were made at this visit.  He will continue to be followed by Dr.  Reyes Ivan and Dr. Yehuda Budd.  I would be happy to see him back anytime if I  can be of any further assistance with his care.   Salvatore Decent Dorris Fetch, M.D.  Electronically Signed   SCH/MEDQ  D:  09/13/2008  T:  09/14/2008  Job:  [id number]   cc:   Elmore Guise., M.D.  Tammy R. Collins [place], M.D.

## 2010-05-16 NOTE — Consult Note (Signed)
NAMEAKIA, DESROCHES               ACCOUNT NO.:  [id number]   MEDICAL RECORD NO.:  [id number]          PATIENT TYPE:  INP   LOCATION:  2030                         FACILITY:  [facility]   PHYSICIAN:  Salvatore Decent. Dorris Fetch, M.D.DATE OF BIRTH:  1930-12-11   DATE OF CONSULTATION:  08/10/2008  DATE OF DISCHARGE:                                 CONSULTATION   REASON FOR CONSULTATION:  A 3-vessel coronary artery disease with  accelerating angina.   HISTORY OF PRESENT ILLNESS:  Mr. Taras is a 77 year old gentleman who  presents with a chief complaint of chest pain.  He has noted over the  past couple of months substernal chest discomfort.  This initially  started about 6-8 weeks ago, it would only be when he was doing heavy  exertion.  It was relieved by rest and was relieved within couple of  minutes, it was reproducible.  Over the past 2 weeks, he has had an  acceleration of his symptoms and now has chest tightness with any form  of even mild exertion such as pushing his garbage can to the road.  It  has also lasted longer but still did not resolve with rest.  He has not  had any nausea, diaphoresis, or shortness of breath in association.   His past medical history is significant for:  1. Prostate cancer, treated with seed implants 3 years ago with a      normal PSA.  2. Basal cell carcinoma removed from his shoulder 2 years ago.  3. He has hypertension.   CURRENT MEDICATIONS:  1. Aspirin 81 mg daily.  2. Hydrochlorothiazide 25 mg daily.  3. Selenium 50 mcg daily.  4. Toprol-XL 50 mg daily.  5. He also takes a calcium, potassium, zinc supplement daily.   He has no known drug allergies.   His family history is significant for coronary artery disease.  His  brother had bypass surgery in his mid 109s.  Both brother and his mother  had heart failure.   SOCIAL HISTORY:  He is married.  He is retired and worked in Armed forces technical officer.  He is very active.  He has a remote history of  tobacco abuse  and quit in his 35s.  He will have an occasional alcohol drink.   REVIEW OF SYSTEMS:  Overall, he has been feeling well.  As noted, he has  been getting fatigued, no more tired recently.  No stroke or TIA  symptoms.  No history of excessive bleeding or bruising.  No orthopnea,  paroxysmal nocturnal dyspnea, or peripheral edema.  No varicose veins.  All other systems are negative.   On physical examination, Mr. Saric is a well-appearing 77 year old  gentleman in no acute distress.  His blood pressure of 156/85, pulse is  58 and regular, respirations are 16.  In general, he is well developed  and well nourished and thin.  His HEENT exam is unremarkable.  His neck  is supple without thyromegaly, adenopathy, or bruits.  His cardiac exam  has regular rate and rhythm.  Normal S1 and S2.  There is a 2/6 systolic  murmur.  His lungs are clear.  Abdomen is soft, nontender.  Extremities  without clubbing, cyanosis, or edema.  He has 2+ pulses throughout.  His  skin is warm and dry.   LABORATORY DATA:  PT is 10.3, PTT 29, glucose 79, BUN 16, creatinine  0.8, sodium 142, potassium 4.5, calcium is 10.  White count is 6.3,  hematocrit 45, platelets 139.  EKG shows sinus rhythm, no acute ischemic  changes.   IMPRESSION:  Mr. Jowett is a 77 year old gentleman who presents with  accelerating angina.  He has significantly worsened over the past 2  weeks.  In cardiac catheterization, he has 3-vessel coronary disease and  he is referred for coronary artery bypass grafting.  Coronary artery  bypass grafting is indicated for survival benefit as well as relief of  symptoms.  I had a detailed discussion with the patient's family  regarding the nature of the operation, incision to be used, need for  general anesthesia, expected hospital stay, and overall recovery.  We  also discussed the general conduct of the procedure.  We did discuss the  risks and he understands the risks include but not  limited to death,  stroke, myocardial infarction, deep venous thrombosis, pulmonary  embolism, bleeding, possibly need to transfusion, infections, as well as  other organ system dysfunction including respiratory, renal, or GI  complications.  He understands and accepts these risks and agrees to  proceed.  He has been scheduled for surgery first case tomorrow morning.      Salvatore Decent Dorris Fetch, M.D.  Electronically Signed     SCH/MEDQ  D:  08/10/2008  T:  08/11/2008  Job:  [id number]   cc:   Elmore Guise., M.D.  Tammy R. Collins [place], M.D.

## 2010-05-16 NOTE — Discharge Summary (Signed)
[name], [name]               ACCOUNT NO.:  [id number]   MEDICAL RECORD NO.:  [id number]          PATIENT TYPE:  INP   LOCATION:  2017                         FACILITY:  [facility]   PHYSICIAN:  Salvatore Decent. Dorris Fetch, M.D.DATE OF BIRTH:  22-Nov-1930   DATE OF ADMISSION:  08/10/2008  DATE OF DISCHARGE:                               DISCHARGE SUMMARY   ADMITTING DIAGNOSES:  1. Multivessel coronary artery disease (with an ejection fraction of      55%).  2. History of hypertension.  3. Remote history of tobacco abuse.  4. History of prostate cancer.   DISCHARGE DIAGNOSES:  1. Multivessel coronary artery disease (with an ejection fraction of      55%).  2. History of hypertension.  3. Remote history of tobacco abuse.  4. History of prostate cancer.  5. Acute blood loss anemia.  6. Postoperative thrombocytopenia   PROCEDURES:  1. Cardiac catheterization performed by Dr. Reyes Ivan on August 10, 2008.  2. CABG x5 (LIMA to LAD, SVG to diagonal 1, SVG to OM1, SVG      sequentially to posterior descending and distal RCA with EVH of the      right leg by Dr. Dorris Fetch on August 11, 2008).  3. Re-exploration of mediastinum with revision of saphenous vein graft      to posterior descending and distal right coronary artery.   HISTORY OF PRESENT ILLNESS:  This is a 77 year old Caucasian male with  the aforementioned medical history who had noted over the past couple of  months he had been experiencing substernal chest discomfort.  According  to medical records, this started approximately 6-8 weeks ago and  occurred only with heavy exertion.  It was relieved by rest and was  usually relieved within a couple of minutes.  Over the past 2 weeks,  however, he has had an acceleration of his symptoms.  His chest  tightness now recurs with any form of even mild exertion, episodes have  been lasting longer, and they did resolve with rest.  He denied any  associated nauseousness, emesis, diaphoresis,  or shortness of breath.  The patient underwent a cardiac catheterization on August 10, 2008.  He  was found to have preserved left ventricular function with multivessel  coronary artery disease.  A cardiothoracic consultation was obtained  with Dr. Dorris Fetch.  Preoperative carotid duplex studies revealed no  significant internal carotid artery stenosis and ABIs were normal  bilaterally.  The patient underwent the aforementioned CABG x5 on August 11, 2008.  Shortly after arriving in the CV ICU, the patient had a v fib  arrest.  He was on multiple drips.  EKG did not show any obvious  changes.  TEE showed inferior wall and right ventricle severe  hypokinesis.  The patient underwent a reexploration of the mediastinal,  revision of the saphenous vein graft to the PDA and the RCA.   BRIEF HOSPITAL COURSE STAY:  The patient was extubated without  difficulty in the early a.m. on postoperative day #1.  The patient  remained afebrile and hemodynamically stable.  He was found to have  postoperative thrombocytopenia.  Platelet count went as low as 64,000.  He was not on any Lovenox or heparin-like products.  His last platelet  count was up to 84,000.  A-line, Swan-Ganz catheter, and chest tubes  were all removed on postoperative day #1.  He was weaned off his drips  as tolerated.  IV amiodarone which had been started after re-exploration  of the mediastinum was continued.  He did develop bradycardia and this  was subsequently discontinued on August 13, 2008.  The patient was also  found to have acute blood loss anemia.  His H and H went down to 7.8 and  22.2 on August 14, 2008.  He was given a unit of packed red blood cells.  Followup H  and H was then 9 and 26.2 respectively.  He was slowly  progressing with cardiac rehab.  He was then transferred from the  intensive care unit to [facility] for further convalescence.  Currently on  postop day #5, the patient had already been tolerating a diet.  He  has  had multiple bowel movements.  He has had no further visual  difficulties.  A couple of days ago, the patient had complaints that  while looking at white objects they appeared yellow, but this has  improved, perhaps this was secondary to the amiodarone which as  previously stated had already been stopped.   PHYSICAL EXAMINATION:  VITAL SIGNS:  He is afebrile.  His vital signs  are stable.  His preoperative weight is 74 kg, today's weight is down to  80 kg.  CARDIOVASCULAR:  Regular rate and rhythm.  PULMONARY:  Slight decreased at the base, left greater than right.  ABDOMEN:  Soft, nontender.  Bowel sounds present.  EXTREMITIES:  Positive lower extremity edema, right great left.  Sternal  and right lower extremity wounds are clean, dry, and continuing to heal.  There is ecchymosis of the right thigh.   The patient did exhibit what was probable AIVR for several beats last  evening and provided he remains in sinus rhythm, is weaned off O2 and  remains afebrile and hemodynamically stable.  He will be discharged on  August 17, 2008.   Latest laboratory studies are as follows:  BMET done August 15, 2008,  potassium 4.1, BUN and creatinine 19 and 1.05 respectively.  CBC also  done this date, H and H 9 and 26.2 respectively, white count of 8100,  platelet count 84,000.  Last chest x-ray done on August 15, 2008, showed  bilateral pleural effusions, left greater than right with bibasilar  atelectasis, no pneumothorax.   Discharge instructions include the following:  1. Activity:  The patient may shower.  He is not to lift more than 10      pounds for 4 weeks.  He is not to drive for 4 weeks.  He to remain      on a low-sodium, heart-healthy diet.  2. Wound care:  The patient is to use soap and water and may let his      wounds open to air.  3. Further instruction:  The patient is to continue with breathing      exercise daily.  He is to walk everyday and increase his frequency       and duration as tolerates.   His followup appointments include:  1. The patient needs to contact Dr. Silva Bandy office for followup      appointment within 2 weeks.  2. The patient has an appointment to  see Dr. Dorris Fetch on September 13, 2008, at 11:45 a.m.  Prior to this office appointment, a chest      x-ray will be obtained.   Discharge medications include the following:  1. Metoprolol tartrate 25 mg p.o. daily.  2. Enteric-coated aspirin 81 mg p.o. daily.  3. Lipitor 40 mg p.o. at bedtime.  4. Selenium 1 tablet, dosage as taken preoperatively p.o. daily.  5. Furosemide 40 mg p.o. daily.  6. KCl 20 mEq p.o. daily.  Both potassium and Lasix for 7 days.  7. Ultram 50 mg 1-2 tablets every 4-6 hours as needed for pain.      Doree Fudge, PA      Viviann Spare C. Dorris Fetch, M.D.  Electronically Signed    DZ/MEDQ  D:  08/16/2008  T:  08/17/2008  Job:  [id number]   cc:   Elmore Guise., M.D.  Tammy R. Collins [place], M.D.

## 2010-05-16 NOTE — Cardiovascular Report (Signed)
[name], [name]               ACCOUNT NO.:  [id number]   MEDICAL RECORD NO.:  [id number]          PATIENT TYPE:  INP   LOCATION:  2030                         FACILITY:  [facility]   PHYSICIAN:  Elmore Guise., M.D.DATE OF BIRTH:  Oct 18, 1930   DATE OF PROCEDURE:  08/10/2008  DATE OF DISCHARGE:                            CARDIAC CATHETERIZATION   INDICATIONS FOR PROCEDURE:  Unstable angina.   HISTORY OF PRESENT ILLNESS:  [name] is a very pleasant 77 year old  white male with past medical history of hypertension and prostate cancer  who presented to the office for evaluation of chest discomfort.  He has  had increasing anginal symptoms.  Today, actually having angina just  with putting his clothes on.  He is now referred for cardiac  catheterization.   DESCRIPTION OF PROCEDURE:  The patient was brought to the Cardiac Cath  Lab.  After appropriate informed consent, he was prepped and draped in  sterile fashion.  Approximately 10 mL were used for local anesthesia at  the right femoral artery.  Right femoral artery sheath was placed  easily.  Unfortunately on placing the J-wire in the sheath we had  difficulty getting this to go to the common femoral and the wire was  removed.  An angiogram was performed which showed a localized  dissection.  Decision at that time was to leave the sheath in place  because of good flow and go to the contralateral leg.  Approximately 10  mL of 1% lidocaine was used for local anesthesia on the left side.  A 5-  French sheath was placed in the left femoral artery without difficulty.  The J-wire advanced easily up the left femoral to the aorta, coronary  angiography, LV angiography and abdominal aortic angiography were  performed.  The patient tolerated the procedure well and was transferred  from the Cardiac Cath Lab in stable condition.   FINDINGS:  1. Left Main:  Calcified with no significant obstructive disease.  2. LAD:  Small to  moderate-sized vessel with proximal calcification.      Proximal luminal irregularities noted.  There is a focal eccentric      80-90% stenosis noted prior to the bifurcation of the first      diagonal.  There were mid and distal luminal irregularities noted.      D1 is a small vessel with ostial 70% stenosis.  3. LCX:  Moderate-sized vessel with 50% mid stenosis, nondominant.      RCA:  Dominant, moderately calcified with a proximal to mid 80-90%      stenosis followed by mid 95% stenosis.  Distal RCA has mild-to-      moderate luminal irregularities.  PDA and PLV are both patent with      mild luminal irregularities.  4. LV:  EF 55%.  No wall motion abnormalities.  LVEDP is 11 mmHg.  5. Distal aorta has no significant disease.  Renal arteries are patent      without significant disease.  The right femoral localized      dissection has been tacked up by normal flow.  This  has totally      resolved from what was seen at the beginning of case.  Left femoral      artery has no significant problems.  There was no filling defects      noted on abdominal aortic angiogram.   IMPRESSION:  1. Multivessel coronary artery disease with significant stenosis noted      in the right coronary artery, focal proximal stenosis in the left      anterior descending and moderate disease in the circumflex vessel.  2. Preserved left ventricular systolic function, ejection fraction      55%.   PLAN:  Currently, I have recommended CT surgery consult for  revascularization.  Stenting the RCA would likely require stenting on  most of the proximal and mid vessel.  With his disease in the RCA and  proximal LAD, I think he  is better served at this time with surgical revascularization.  We will  continue his aspirin, statin, beta-blocker.  I will admit him to the  hospital because of his increasing angina.  I do plan to start heparin  drip after his sheath is pulled if he has no problems with postprocedure   bleeding.      Elmore Guise., M.D.  Electronically Signed     TWK/MEDQ  D:  08/10/2008  T:  08/10/2008  Job:  [id number]   cc:   Tammy R. Collins [place], M.D.

## 2010-05-16 NOTE — Op Note (Signed)
NAMEALRICK, CUBBAGE               ACCOUNT NO.:  [id number]   MEDICAL RECORD NO.:  [id number]          PATIENT TYPE:  INP   LOCATION:  2308                         FACILITY:  [facility]   PHYSICIAN:  Salvatore Decent. Dorris Fetch, M.D.DATE OF BIRTH:  1930/11/29   DATE OF PROCEDURE:  08/11/2008  DATE OF DISCHARGE:                               OPERATIVE REPORT   PREOPERATIVE DIAGNOSIS:  Myocardial ischemia following coronary artery  bypass grafting.   POSTOPERATIVE DIAGNOSIS:  Myocardial ischemia following coronary artery  bypass grafting.   PROCEDURE:  Re-exploration of mediastinum, extracorporeal circulation,  revision of saphenous vein graft to posterior descending distal right  coronary.   SURGEON:  Salvatore Decent. Dorris Fetch, MD   ASSISTANT:  Coral Ceo, PA   ANESTHESIA:  General.   FINDINGS:  TEE showed impaired right ventricular inferior wall motion.  Kinking of the graft at the insertion site to the posterior descending  possibly due to redundancy of the vein between the posterior descending  and distal right coronary anastomoses, markedly improved inferior wall  motion and right ventricular function post bypass.   CLINICAL NOTE:  Mr. Kuhlman is a 77 year old gentleman who earlier today  had undergone coronary artery bypass grafting for three-vessel coronary  artery disease.  He shortly after being transported to the intensive  care unit, had a sudden ventricular fibrillation arrest, CPR was  initiated.  The patient was defibrillated.  He then was hemodynamically  unstable.  EKG failed to show any significant ischemic changes; however,  transesophageal echocardiography revealed severe inferior and right  ventricular hypokinesis.  The patient's family was advised of the need  for urgent reoperation.  The patient was stabilized hemodynamically and  transported to the operating room.   Once in the operating room, the patient had induction of general  anesthesia.  He did remain  hemodynamically stable.  The chest, abdomen  and left leg were prepped and draped in sterile fashion.  Previous  median sternotomy incision was opened.  Suture material was removed.  The wires were removed and a retractor was placed.  There was no  significant hemodynamic improvement with opening of the sternum.  The  pericardium was opened.  The right coronary graft was examined with a  Doppler and had a weaker signal than the other two vein grafts.  Inspection of the graft revealed kinking at the site of the side-to-side  anastomosis with straightening of this area, the flow signal improved.  It was noted there was redundant vein in the segment between the  sequential anastomoses with a side-to-side posterior descending and end-  to-side to the distal right coronary and was decided to revise this vein  in this area.  The patient was fully heparinized.  After confirming  adequate anticoagulation with ACT measurement, the aorta was cannulated  via concentric 2-0 Ethilon pledgets of pursestring sutures.  A dual  stage venous cannulas was placed via pursestring suture in the right  atrium.  Of note, the right ventricle and right atrium were  significantly distended.  Cardiopulmonary bypass was instituted.  Flows  were maintained per protocol.  The patient was  cooled to 32 degrees  Celsius.  A foam pad was placed in the pericardium to insulate the heart  and protect the left phrenic nerve.  A temperature probe was placed in  myocardial septum and a cardioplegia cannula was placed in the ascending  aorta.   The aorta was crossclamped.  A bulldog clamp was placed across the left  mammary artery.  Cardiac arrest was achieved with cold antegrade blood  cardioplegia and topical iced saline.  After achieving a complete  diastolic arrest and adequate myocardial septal cooling to 11 degrees  Celsius with 1 L of cardioplegia, the heart was retracted along the  acute margin exposing the vein graft.   The vein graft was divided on  angle between the side-to-side and end-to-side anastomoses and short  segment approximately 3-4 mm in length was excised.  A probe was passed  distally into the distal right coronary.  There was some resistance in  removal of a suture that have been placed of the graft were bleeding  allowed easier passage of the probe.  A probe did pass easily into the  posterior descending through the side-to-side anastomosis.  The vein-to-  vein anastomosis then was performed with a running 7-0 Prolene suture.  The graft was de-aired before tying the suture.  After completing the  vein to vein anastomosis, a small incision was made in the vein and one  and a half probe passed easily into the distal right coronary as well as  into the posterior descending through each of the anastomosis  respectively.  This small venotomy then was closed with a 7-0 Prolene  suture.  The aortic crossclamp was removed.  The crossclamp time was 38  minutes.  The patient did not require defibrillation and spontaneously  resumed sinus rhythm.  Of note at the beginning of the procedure, small  needle holes have been placed in each of vein grafts.  No air was noted  through any of the vein grafts, but there was ongoing continued bleeding  from the needle hole in the OM graft.  This was repaired with an 8-0  Prolene suture.  When the patient had rewarmed to 37 degrees Celsius, he  was weaned from cardiopulmonary bypass.  He was on dopamine at 5 mcg/kg  per minute, in sinus rhythm, and also on no epinephrine and Neo-  Synephrine infusions to help maintain blood pressure.  After weaning  from bypass, the patient was noted to have markedly improved inferior  wall motion.  There was no right ventricular or right atrial distention.  Right ventricular systolic function appeared good by echocardiography.  The patient remained hemodynamically stable throughout the post bypass.  A test dose of protamine was  administered and was well tolerated.  The  atrial and aortic cannulae were removed.  The remaining of the protamine  was administered without incident.  Chest was copiously irrigated with  warm saline containing vancomycin.  Hemostasis was achieved.  The left  pleural and mediastinal chest tubes were placed back into their intended  locations.  The upper portion the pericardium was reapproximated over  the proximal anastomoses and the ascending aorta.  The inferior portion  of pericardium was not closed.  Inspection of the right graft revealed  good positioning of the graft with no kinking.  The patient remained  hemodynamically stable and continued to have good inferior wall motion  by echocardiogram throughout the remainder of the procedure.  The chest  sternum was closed with interrupted heavy  gauge stainless steel wires.  The pectoralis fascia, subcutaneous tissue, and skin were closed in  standard fashion.  All sponge, needle, and instrument counts were  correct at the procedure.  The patient was taken from the operating room  to the surgical intensive care unit in fair condition.      Salvatore Decent Dorris Fetch, M.D.  Electronically Signed     SCH/MEDQ  D:  08/11/2008  T:  08/12/2008  Job:  [id number]   cc:   Elmore Guise., M.D.

## 2010-05-16 NOTE — Op Note (Signed)
NAMEKALVIN, BUSS               ACCOUNT NO.:  [id number]   MEDICAL RECORD NO.:  [id number]          PATIENT TYPE:  INP   LOCATION:  2308                         FACILITY:  [facility]   PHYSICIAN:  Salvatore Decent. Dorris Fetch, M.D.DATE OF BIRTH:  12/24/1930   DATE OF PROCEDURE:  08/11/2008  DATE OF DISCHARGE:                               OPERATIVE REPORT   PREOPERATIVE DIAGNOSIS:  Three-vessel coronary artery disease with  accelerating angina.   POSTOPERATIVE DIAGNOSIS:  Three-vessel coronary artery disease with  accelerating angina.   PROCEDURE:  Median sternotomy, extracorporeal circulation, coronary  artery bypass grafting x5 (left internal mammary artery to left anterior  descending, saphenous vein graft to first diagonal, saphenous vein graft  obtuse marginal 1, sequential saphenous vein graft to posterior  descending and distal right coronary), and endoscopic vein harvest,  right leg.   SURGEON:  Salvatore Decent. Dorris Fetch, MD   FIRST ASSISTANT:  Sheliah Plane, MD   SECOND ASSISTANT:  Sol Blazing, PA   ANESTHESIA:  General.   FINDINGS:  Vein, large caliber but satisfactory quality.  Mammary, good  quality.  Diagonal 1-mm, fair quality target.  Remaining targets, 1.5-mm  to 2-mm good-quality targets.   CLINICAL NOTE:  Mr. Kohlmann is a 77 year old gentleman who presents with  accelerating angina pectoris.  He underwent cardiac catheterization  where he was found to have severe 3-vessel coronary artery disease and  was referred for coronary artery bypass grafting.  Indications, risks,  benefits, and alternatives were discussed in detail with the patient.  He understood and accepted the risk and agreed to proceed.   OPERATIVE NOTE:  Mr. Byrom was brought to the preop holding area on  August 11, 2008.  There, the Anesthesia Service placed a Swan-Ganz  catheter and arterial blood pressure monitoring lines.  Intravenous  antibiotics were administered.  The patient was taken  to the operating  room, anesthetized, and intubated.  A Foley catheter was placed.  The  chest, abdomen, and legs were prepped and draped in usual sterile  fashion.   A median sternotomy was performed and the left internal mammary artery  was harvested using standard technique.  Simultaneously, an incision  made in the medial aspect of the right leg at the level of the knee and  greater saphenous vein was harvested endoscopically from midcalf to  groin.  Saphenous vein was relatively large in caliber.  There were some  varicosities, but these areas were able to be excluded.  The vein was  satisfactory.  Heparin 2000 units was administered during the vessel  harvest.  Remainder of the full heparin dose was given prior to opening  the pericardium.   The pericardium was opened.  The ascending aorta was inspected.  It was  cannulated via concentric 2-0 Ethibond pledgeted pursestring sutures.  A  dual-stage venous cannula was placed via pursestring suture in the right  atrial appendage.  Cardiopulmonary bypass was instituted and the patient  was cooled to 32 degrees Celsius.  The coronary arteries were inspected  and anastomotic sites were chosen.  The conduits were inspected and cut  to length.  A foam pad was placed in the pericardium to insulate the  heart and protect the left phrenic nerve.  A temperature probe was  placed in myocardial septum and the cardioplegic cannula was placed in  the ascending aorta.   The aorta was cross-clamped.  The left ventricle was emptied via aortic  root vent.  Cardiac arrest then was achieved with a combination of cold  antegrade blood cardioplegia and topical iced saline.  One liter of  cardioplegia was administered.  The myocardial temperature fell to 10  degrees Celsius.  The following distal anastomoses were performed.   First, a reverse saphenous vein graft was placed sequentially to the  posterior descending and distal right coronary artery.   Posterior  descending was 1.5-mm good-quality vessel.  A side-to-side anastomosis  was performed to this vessel and an end-to-side anastomosis then was  performed to the distal right coronary beyond the takeoff of the  posterior descending.  This was a 2-mm vessel at the site.  Both were  performed with running 7-0 Prolene sutures.  All anastomoses were probed  proximally and distally at their completion to ensure patency.  At the  completion of the anastomosis, cardioplegia was administered down the  vein graft.  There was good flow.  There was some bleeding at the toe of  the distal right coronary anastomosis which was repaired with a 7-0  Prolene suture.   Next, a reverse saphenous vein graft was placed end-to-side to obtuse  marginal 1.  This was a 1.5 mm good-quality target.  There was some  plaquing distally.  The vein graft was again large caliber, was  anastomosed end-to-side with a running 7-0 Prolene suture.  There was  good flow and good hemostasis.   Next, a reverse saphenous vein graft was placed end-to-side to the first  diagonal, was a significant size mismatch and the diagonal was only a 1-  mm vessel.  The vein graft was anastomosed end-to-side with a running 7-  0 Prolene suture.  There was satisfactory flow and good hemostasis.   Next, the left internal mammary artery was brought through a window in  the pericardium.  The distal end was beveled.  It was then anastomosed  end-to-side to the distal LAD.  The LAD was a 1.5-mm good-quality  target.  The mammary was a 1.5-mm good-quality conduit.  The anastomosis  was performed with a running 8-0 Prolene suture.  At completion of the  mammary to LAD anastomosis, the bulldog clamps were briefly removed to  inspect for hemostasis.  Immediate rapid septal rewarming was noted.  The bulldog clamp was placed and the mammary pedicle was tacked to the  epicardial surface of the heart with 6-0 Prolene sutures.   Additional  cardioplegia was administered.  The vein grafts were cut to  length.  The cardioplegic cannulas removed from the ascending aorta and  the proximal vein graft anastomoses were performed to 4.5 mm punch  aortotomies with running 6-0 Prolene sutures.  After the completion of  the final proximal anastomoses, the patient was placed in Trendelenburg  position.  Lidocaine was administered.  The aortic root was de-aired and  aortic cross-clamp was removed.  Total cross-clamp time was 82 minutes.   While the patient was being rewarmed, all proximal and distal  anastomoses were inspected for hemostasis.  Epicardial pacing wires were  placed on the right ventricle and right atrium and DDD pacing was  initiated.  When the patient rewarmed to a core  temperature of 37  degrees Celsius, he was weaned from cardiopulmonary bypass on the first  attempt.  The total bypass time was 129 minutes.  The patient did not  require inotropic support.  Initial cardiac index was greater than 2  liters per minute meter squared.  With paucity in the pacemaker, there  was noted to be some ST elevation initially, this resolved.  The patient  did require subsequent pacing again for bradycardia.  A test dose of  protamine was administered and was well tolerated.  The atrial and  aortic cannulae were removed.  Remainder of protamine was administered  without incident.  The chest was irrigated with 1 L of warm normal  saline containing 1 g of vancomycin.  Hemostasis was achieved.  A left  pleural and 2 mediastinal chest tubes were placed through separate  subcostal incisions.  The pericardium was reapproximated with  interrupted 3-0 silk sutures.  Skin together easily without tension.  The sternum was closed with combination of single and double heavy gauge  stainless steel wires.  The pectoralis fascia, subcutaneous tissue, and  skin were closed in standard fashion.  All  sponge, needle, and instrument counts were correct at  the end of the  procedure.  The patient remained hemodynamically stable throughout post  bypass period and was taken from the operating room to the surgical  intensive care unit in fair condition.      Salvatore Decent Dorris Fetch, M.D.  Electronically Signed     SCH/MEDQ  D:  08/11/2008  T:  08/12/2008  Job:  [id number]   cc:   Elmore Guise., M.D.

## 2010-05-19 NOTE — Op Note (Signed)
[name], [name]               ACCOUNT NO.:  [id number]   MEDICAL RECORD NO.:  [id number]          PATIENT TYPE:  OIB   LOCATION:  1511                         FACILITY:  [facility]   PHYSICIAN:  Marlowe Kays, M.D.  DATE OF BIRTH:  Jul 28, 1930   DATE OF PROCEDURE:  09/06/2005  DATE OF DISCHARGE:                                 OPERATIVE REPORT   PREOPERATIVE DIAGNOSES:  1. Degenerative arthritis, acromioclavicular joint.  2. Chronic impingement syndrome with partial versus full-thickness rotator      cuff tear, right shoulder.   POSTOPERATIVE DIAGNOSES:  1. Degenerative arthritis, acromioclavicular joint.  2. Chronic impingement syndrome with full-thickness rotator cuff tear,      right shoulder.   OPERATION:  1. Arthroscopy of right shoulder with debridement of underneath surface of      articular surface of rotator cuff and labrum.  2. Arthroscopic subacromial and distal clavicle decompression.  3. Mini open rotator cuff repair using a rotator cuff anchor.   SURGEON:  Marlowe Kays, M.D.   ASSISTANT:  Mr. Alvera Novel, P.A.-C.   ANESTHESIA:  General.   PATHOLOGY AND JUSTIFICATION FOR PROCEDURE:  He has had a painful shoulder of  about 2-1/2 months' duration with an MRI demonstrating preoperative  diagnoses #1 and #2 above.  He is here today with the understanding that we  will try and perform the surgery arthroscopically, but if there was a full-  thickness tear, we would want to get the best repair of it possible.   PROCEDURE:  After satisfactory general anesthesia, beach-chair position on  the Allen frame, right shoulder girdle was prepped with DuraPrep and draped  into a sterile field.  Anatomy of the shoulder joint was marked out, as well  as potential rotator cuff incision.  The posterior and lateral portals were  also marked out and infiltrated along with the subacromial space with 0.5%  Marcaine with adrenaline.  Through a posterior soft spot portal, I  atraumatically entered the glenohumeral joint.  He had a good bit of  synovitis present with significant fraying of the undersurface of the  rotator cuff, some glenoid wear and some minor labral degeneration.  I  advanced the scope between biceps and subscapularis, made an incision over a  switching stick and I then placed a metal cannula followed by 4.2 shaver,  debriding down the rotator cuff synovitis and labrum.  Pre and post films  were taken.  I then redirected the scope in the subacromial space and  through a lateral portal, I introduced a 4.2 shaver and began removing  bursal tissue.  There was some fraying of the rotator cuff, which I shaved  down.  I then used the Arthrocare vaporizer to remove soft tissue from the  underneath surface of the distal clavicle, which was protuberant, and the  underneath surface of the acromion.  I followed this with the 4-mm oval bur,  burring down bone and then moving back and forth between the 3 instruments  until we had a smooth rotator cuff and a wide decompression, removing distal  clavicle on the underneath surface and acromion.  The rotator cuff at first  glance appeared to be intact, other than some minor fraying, but on  abduction and external rotation, I did find an area of tear on the anterior  distal rotator cuff.  I used a probe to better define this and it appeared  to be about 7 mm in width with some slight retraction.  Accordingly, I  placed a needle at the level of the tear and using this as a localizer, I  made a small incision, which ended up about 5 cm, and worked my way through  the deltoid to find the area of the torn rotator cuff.  I then placed a  rotator cuff anchor in the lateral humeral cortex and brought the 2 arms  through the underneath surface, exiting superiorly on the small tear.  with  the arm abducted, I was able to stabilize the tear at this location.  I  supplemented this with two additional sutures, getting what  appeared to be  nice stable repair.  Several pictures were taken of the tear, both when the  wound was opened and also when it was repaired.  The soft tissues here were  also infiltrated with 0.5% Marcaine with adrenaline.  The deltoid was  reapproximated with interrupted #1 Vicryl, subcu tissue with 2-0 Vicryl.  Steri-Strips on the skin.  The 3 portals were closed with 4-0 nylon and  covered with Betadine and Adaptic.  A dry sterile dressing was applied to  all the wounds followed by a shoulder immobilizer.  He tolerated the  procedure well and was taken to the recovery room in satisfactory condition  with no known complications.           ______________________________  Marlowe Kays, M.D.     JA/MEDQ  D:  09/06/2005  T:  09/07/2005  Job:  [id number]

## 2010-05-19 NOTE — Op Note (Signed)
[name], [name]               ACCOUNT NO.:  [id number]   MEDICAL RECORD NO.:  [id number]          PATIENT TYPE:  INP   LOCATION:  1610                         FACILITY:  [facility]   PHYSICIAN:  Marlowe Kays, M.D.  DATE OF BIRTH:  [birth year]   DATE OF PROCEDURE:  01/10/2006  DATE OF DISCHARGE:                               OPERATIVE REPORT   PREOPERATIVE DIAGNOSES:  1. Glenohumeral arthritis with chronic synovitis and loose bodies.  2. Chronic impingement syndrome with rotator cuff tendinopathy.  3. Degenerative arthritis of acromioclavicular joint contributing to      impingement problem.   POSTOPERATIVE DIAGNOSES:  1. Glenohumeral arthritis with chronic synovitis and loose bodies.  2. Chronic impingement syndrome with rotator cuff tendinopathy.  3. Degenerative arthritis of acromioclavicular joint contributing to      impingement problem.   OPERATION:  1. Left shoulder arthroscopy with extensive debridement of the      glenohumeral joint, humeral head, synovium, labrum and removal of      loose bodies and rotator cuff.  2. Arthroscopic subacromial decompression.  3. Arthroscopic resection of the inferior distal clavicle.   SURGEON:  Marlowe Kays, M.D.   ASSISTANTDruscilla Brownie. [name], P.A.-C.   ANESTHESIA:  General.   PATHOLOGY AND JUSTIFICATION FOR PROCEDURE:  He has recently recovered  from her right shoulder arthroscopy with the right shoulder joint being  completely different from his left, which demonstrated the severe  degenerative arthritis with flattening of the humeral head and spurring.  MRI has demonstrated no full rotator cuff tear, but the findings noted  under diagnoses.  Accordingly, he is here today for the above-mentioned  surgery.  He understands that the surgery is designed only to improve  the function of the shoulder and not restore it as well as his right  shoulder.   PROCEDURE:  Under satisfactory general anesthesia, beach-chair position  on the Allen frame, left shoulder girdle was prepped with DuraPrep and  draped into a sterile field.  Anatomy the shoulder joint was marked out  and lateral and posterior soft spot portals and subacromial space were  infiltrated with 0.5% Marcaine with adrenalin.  Through a posterior soft  spot portal, I atraumatically entered the glenohumeral joint with  extensive arthritic and reactive changes in keeping with the MRI.  The  anatomy was so distorted it made initial visualization somewhat  difficult, but I was able to locate the biceps tendon, which was intact,  and advance scope between end of the subscapularis anteriorly and using  a switching stick, I made an anterior incision and I then placed a metal  cannula over the switching stick and I entered the cannula into the  joint.  I followed this with [place] 4.2 shaver and began debriding  down the humeral head, which was down to raw bone in areas, a very  degenerated labrum, with the glenoid being worn down as well.  I removed  synovium, multiple loose bodies and shaved down the underneath surface  of the rotator cuff.  I then redirected the scope into the subacromial  space and through a  lateral portal, introduced a 4.2 shaver.  He had a  large amount of bursal tissue, but I was eventually able to clear this  and brought in the Arthrocare 90-degree vaporizer, which I used to  debride soft tissue from the underneath surface of the acromion and the  underneath surface of the [facility] joint, which was a pathologic entity.  I  followed this with a 4-mm oval bur, burring down the underneath surface  of the acromion and distal clavicle.  I rotated back and forth between  the bur, the vaporizer and 4.2 shaver, also smoothing down the rotator  cuff until we had a wide decompression as documented by pictures with  his arm to his side and the arm abducted.  All fluid possible was then  removed.  The 3 portals were all once again infiltrated 0.5%  Marcaine  with adrenaline, as was the subacromial space.  The portals were then  closed with 4-0 nylon.  Betadine, Adaptic, dry sterile dressing and  shoulder immobilizer were applied.  He tolerated the procedure well and  was taken to the recovery room in satisfactory condition with no known  complications.           ______________________________  Marlowe Kays, M.D.     JA/MEDQ  D:  01/10/2006  T:  01/11/2006  Job:  [id number]

## 2010-07-11 ENCOUNTER — Encounter: Payer: Self-pay | Admitting: Cardiology

## 2010-07-18 ENCOUNTER — Ambulatory Visit (INDEPENDENT_AMBULATORY_CARE_PROVIDER_SITE_OTHER): Payer: Medicare Other | Admitting: Cardiology

## 2010-07-18 ENCOUNTER — Encounter: Payer: Self-pay | Admitting: Cardiology

## 2010-07-18 DIAGNOSIS — I2510 Atherosclerotic heart disease of native coronary artery without angina pectoris: Secondary | ICD-10-CM

## 2010-07-18 DIAGNOSIS — R5383 Other fatigue: Secondary | ICD-10-CM | POA: Insufficient documentation

## 2010-07-18 DIAGNOSIS — I10 Essential (primary) hypertension: Secondary | ICD-10-CM

## 2010-07-18 DIAGNOSIS — R5381 Other malaise: Secondary | ICD-10-CM

## 2010-07-18 DIAGNOSIS — E785 Hyperlipidemia, unspecified: Secondary | ICD-10-CM | POA: Insufficient documentation

## 2010-07-18 MED ORDER — RAMIPRIL 10 MG PO CAPS: 10.0000 mg | ORAL_CAPSULE | Freq: Every day | ORAL | Status: DC

## 2010-07-18 NOTE — Assessment & Plan Note (Signed)
His fatigue may be related to his bradycardia. He is only on a low dose of metoprolol. We will check lab work today including CBC, chemistries, and TSH.

## 2010-07-18 NOTE — Assessment & Plan Note (Signed)
He is asymptomatic from a coronary standpoint. We will continue with his risk factor modification.

## 2010-07-18 NOTE — Assessment & Plan Note (Signed)
His blood pressure is significantly elevated today. We will increase his Altace to 10 mg daily. I have asked that he monitor his blood pressure at home. He needs to restrict his sodium intake. We will have him followup again in 3 weeks to see how his blood pressure is doing.

## 2010-07-18 NOTE — Progress Notes (Signed)
   [name] Date of Birth: Sep 28, 1930   History of Present Illness: [name] is seen today for followup. He complains recently of feeling fatigue. He states he falls asleep easily in the afternoon. He has had occasional sharp shocklike sensations running through his chest. These have been infrequent. His blood pressure has been mildly elevated at home. He denies any shortness of breath. He has had no dizziness or syncope.  Current Outpatient Prescriptions on File Prior to Visit  Medication Sig Dispense Refill  . aspirin 81 MG tablet Take 81 mg by mouth daily.        Marland Kitchen atorvastatin (LIPITOR) 40 MG tablet Take 40 mg by mouth daily.        . metoprolol succinate (TOPROL-XL) 25 MG 24 hr tablet Take 25 mg by mouth daily.        . ramipril (ALTACE) 5 MG tablet Take 5 mg by mouth daily.        Marland Kitchen selenium 50 MCG TABS Take 200 mcg by mouth daily.          No Known Allergies  Past Medical History  Diagnosis Date  . Coronary artery disease   . Hypertension   . Hyperlipidemia   . History of prostate cancer     s/p radioactive seed implant    Past Surgical History  Procedure Date  . Radioactive seed implant   . Coronary artery bypass graft 08/2008    x5. LIMA GRAFT TO LAD, SAPHENOUS VEIN GRAFT TO THE DIAGONAL , SAPHENOUS VEIN GRAFT TO THE FIRST OBTUSE MARGINAL VESSEL, AND SAPHENOUS VEIN GRAFT TO THE RIGHT CORONARY VESSELS.  Marland Kitchen Shoulder surgery   . Cardiac catheterization 08/10/2008    EF 55%    History  Smoking status  . Former Smoker  . Quit date: 07/11/1970  Smokeless tobacco  . Not on file    History  Alcohol Use No    Family History  Problem Relation Age of Onset  . Heart failure Mother   . Heart failure Brother   . Heart disease Brother     Review of Systems: The review of systems is positive for fatigue.  All other systems were reviewed and are negative.  Physical Exam: BP 182/98  Pulse 48  Ht 5\' 9"  (1.753 m)  Wt 157 lb 3.2 oz (71.305 kg)  BMI 23.21  kg/m2 He is a pleasant white male in no acute distress. He is normocephalic, atraumatic. Pupils are round and reactive to light and accommodation. Extraocular movements are full. Oropharynx is clear. Neck is supple no JVD, adenopathy, thyromegaly, or bruits. Lungs are clear. Cardiac exam reveals a regular rate and rhythm without gallop, murmur, or click. Abdomen is soft and nontender. He has no edema. Pedal pulses are good. He is alert and oriented x3. Cranial nerves II through XII are intact. LABORATORY DATA: ECG demonstrates sinus bradycardia with a rate of 49 beats per minute. It is otherwise normal.  Assessment / Plan:

## 2010-07-18 NOTE — Patient Instructions (Addendum)
Increase your Altace to 10 mg daily.  We will check blood work today to check chemistries, blood counts and thyroid.  Watch your salt intake.  Continue your exercise program.  I will have you see Lawson Fiscal again in 3 weeks.

## 2010-07-19 LAB — HEPATIC FUNCTION PANEL
ALT: 25 U/L (ref 0–53)
AST: 25 U/L (ref 0–37)
Bilirubin, Direct: 0.2 mg/dL (ref 0.0–0.3)
Total Bilirubin: 1.2 mg/dL (ref 0.3–1.2)
Total Protein: 7.2 g/dL (ref 6.0–8.3)

## 2010-07-19 LAB — CBC WITH DIFFERENTIAL/PLATELET
Basophils Absolute: 0.1 10*3/uL (ref 0.0–0.1)
Eosinophils Absolute: 0.2 10*3/uL (ref 0.0–0.7)
Lymphocytes Relative: 33.9 % (ref 12.0–46.0)
MCHC: 34.2 g/dL (ref 30.0–36.0)
MCV: 93.7 fl (ref 78.0–100.0)
Monocytes Absolute: 0.6 10*3/uL (ref 0.1–1.0)
Neutrophils Relative %: 51.5 % (ref 43.0–77.0)
Platelets: 127 10*3/uL — ABNORMAL LOW (ref 150.0–400.0)
RBC: 4.6 Mil/uL (ref 4.22–5.81)
RDW: 12.9 % (ref 11.5–14.6)

## 2010-07-19 LAB — BASIC METABOLIC PANEL
CO2: 31 mEq/L (ref 19–32)
Chloride: 109 mEq/L (ref 96–112)
Creatinine, Ser: 0.9 mg/dL (ref 0.4–1.5)
Potassium: 5.7 mEq/L — ABNORMAL HIGH (ref 3.5–5.1)

## 2010-07-19 LAB — TSH: TSH: 1.68 u[IU]/mL (ref 0.35–5.50)

## 2010-07-20 ENCOUNTER — Encounter: Payer: Self-pay | Admitting: Cardiology

## 2010-07-21 ENCOUNTER — Telehealth: Payer: Self-pay | Admitting: *Deleted

## 2010-07-21 DIAGNOSIS — I10 Essential (primary) hypertension: Secondary | ICD-10-CM

## 2010-07-21 NOTE — Telephone Encounter (Signed)
Message copied by Lorayne Bender on Fri Jul 21, 2010  8:43 AM ------      Message from: [place], PETER M      Created: Thu Jul 20, 2010 11:59 AM       Chemistries look good except elevated K+. Avoid potassium rich foods. Repeat BMET in 2 weeks. CBC ok. Platelets mildly reduced. TSH normal.

## 2010-07-21 NOTE — Telephone Encounter (Signed)
Notified of lab results. Will send copy to Dr. Collins Scotland.

## 2010-08-14 ENCOUNTER — Ambulatory Visit (INDEPENDENT_AMBULATORY_CARE_PROVIDER_SITE_OTHER): Payer: Medicare Other | Admitting: Nurse Practitioner

## 2010-08-14 ENCOUNTER — Other Ambulatory Visit: Payer: Medicare Other | Admitting: *Deleted

## 2010-08-14 ENCOUNTER — Encounter: Payer: Self-pay | Admitting: Nurse Practitioner

## 2010-08-14 VITALS — BP 170/80 | HR 64 | Ht 67.5 in | Wt 156.4 lb

## 2010-08-14 DIAGNOSIS — I10 Essential (primary) hypertension: Secondary | ICD-10-CM

## 2010-08-14 DIAGNOSIS — I2510 Atherosclerotic heart disease of native coronary artery without angina pectoris: Secondary | ICD-10-CM

## 2010-08-14 MED ORDER — AMLODIPINE BESYLATE 5 MG PO TABS: 5.0000 mg | ORAL_TABLET | Freq: Every day | ORAL | Status: DC

## 2010-08-14 NOTE — Patient Instructions (Signed)
We are going to add Norvasc 5 mg daily to your medical regimen Continue to watch your salt Monitor your blood pressure I will see you in 3 weeks.

## 2010-08-14 NOTE — Assessment & Plan Note (Signed)
Blood pressure remains up. I have added Norvasc 5 mg daily. I will see him in 3 weeks. He is to continue to monitor at home. Goal blood pressure is less than 135/85. Patient is agreeable to this plan and will call if any problems develop in the interim.

## 2010-08-14 NOTE — Progress Notes (Signed)
    [name] Date of Birth: 02-04-1930   History of Present Illness: Mr. Goynes is seen today for a 3 week check. He is seen for Dr. [place]. He has had his Altace increased for elevated blood pressure. He feels good. Blood pressure remains high at home. He is not using salt. He has some chronic degree of fatigue. He is tolerating his medicines. He has a resting bradycardia. No chest pain.   Current Outpatient Prescriptions on File Prior to Visit  Medication Sig Dispense Refill  . aspirin 81 MG tablet Take 81 mg by mouth daily.        Marland Kitchen atorvastatin (LIPITOR) 40 MG tablet Take 40 mg by mouth daily.        . metoprolol succinate (TOPROL-XL) 25 MG 24 hr tablet Take 25 mg by mouth daily.        . ramipril (ALTACE) 10 MG capsule Take 1 capsule (10 mg total) by mouth daily.  90 capsule  6  . selenium 50 MCG TABS Take 200 mcg by mouth daily.        Marland Kitchen DISCONTD: ramipril (ALTACE) 5 MG tablet Take 5 mg by mouth daily.          No Known Allergies  Past Medical History  Diagnosis Date  . Coronary artery disease     s/p CABG  . Hypertension   . Hyperlipidemia   . History of prostate cancer     s/p radioactive seed implant    Past Surgical History  Procedure Date  . Radioactive seed implant   . Coronary artery bypass graft 08/2008    x5. LIMA GRAFT TO LAD, SAPHENOUS VEIN GRAFT TO THE DIAGONAL , SAPHENOUS VEIN GRAFT TO THE FIRST OBTUSE MARGINAL VESSEL, AND SAPHENOUS VEIN GRAFT TO THE RIGHT CORONARY VESSELS.  Marland Kitchen Shoulder surgery   . Cardiac catheterization 08/10/2008    EF 55%    History  Smoking status  . Former Smoker  . Quit date: 07/11/1970  Smokeless tobacco  . Not on file    History  Alcohol Use No    Family History  Problem Relation Age of Onset  . Heart failure Mother   . Heart failure Brother   . Heart disease Brother     Review of Systems: The review of systems is positive for mild fatigue that is chronic.  All other systems were reviewed and are  negative.  Physical Exam: BP 170/80  Pulse 64  Ht 5' 7.5" (1.715 m)  Wt 156 lb 6.4 oz (70.943 kg)  BMI 24.13 kg/m2 Patient is very pleasant and in no acute distress. Skin is warm and dry. Color is normal.  HEENT is unremarkable. Normocephalic/atraumatic. PERRL. Sclera are nonicteric. Neck is supple. No masses. No JVD. Lungs are clear. Cardiac exam shows a regular rate and rhythm. Abdomen is soft. Extremities are without edema. Gait and ROM are intact. No gross neurologic deficits noted.  LABORATORY DATA: BMET is pending   Assessment / Plan:

## 2010-08-14 NOTE — Assessment & Plan Note (Signed)
He remains asymptomatic.

## 2010-08-15 ENCOUNTER — Other Ambulatory Visit: Payer: Medicare Other | Admitting: *Deleted

## 2010-08-15 ENCOUNTER — Ambulatory Visit (INDEPENDENT_AMBULATORY_CARE_PROVIDER_SITE_OTHER): Payer: Medicare Other | Admitting: *Deleted

## 2010-08-15 DIAGNOSIS — E785 Hyperlipidemia, unspecified: Secondary | ICD-10-CM

## 2010-08-15 DIAGNOSIS — I2510 Atherosclerotic heart disease of native coronary artery without angina pectoris: Secondary | ICD-10-CM

## 2010-08-15 DIAGNOSIS — I10 Essential (primary) hypertension: Secondary | ICD-10-CM

## 2010-08-15 LAB — BASIC METABOLIC PANEL
Calcium: 9.7 mg/dL (ref 8.4–10.5)
Creatinine, Ser: 0.8 mg/dL (ref 0.4–1.5)
GFR: 99.3 mL/min (ref >60.00)
Sodium: 143 mEq/L (ref 135–145)

## 2010-09-06 ENCOUNTER — Encounter: Payer: Self-pay | Admitting: Nurse Practitioner

## 2010-09-06 ENCOUNTER — Ambulatory Visit (INDEPENDENT_AMBULATORY_CARE_PROVIDER_SITE_OTHER): Payer: Medicare Other | Admitting: Nurse Practitioner

## 2010-09-06 DIAGNOSIS — I2510 Atherosclerotic heart disease of native coronary artery without angina pectoris: Secondary | ICD-10-CM

## 2010-09-06 DIAGNOSIS — I10 Essential (primary) hypertension: Secondary | ICD-10-CM

## 2010-09-06 MED ORDER — AMLODIPINE BESYLATE 5 MG PO TABS: 5.0000 mg | ORAL_TABLET | Freq: Every day | ORAL | Status: DC

## 2010-09-06 MED ORDER — NITROGLYCERIN 0.4 MG/SPRAY TL SOLN: 1.0000 | Status: DC | PRN

## 2010-09-06 NOTE — Patient Instructions (Signed)
Lets get back on the Norvasc at 5 mg daily. I have sent the prescription to the drug store I need to see you back in about 3 weeks Start a new blood pressure diary Call for any problems.

## 2010-09-06 NOTE — Assessment & Plan Note (Signed)
Blood pressure remains up. He is willing to take the Norvasc. I have sent another prescription in to the drug store. We will see him again in 3 weeks. He is to continue to monitor his blood pressure at home. Patient is agreeable to this plan and will call if any problems develop in the interim.

## 2010-09-06 NOTE — Assessment & Plan Note (Signed)
I have refilled his NTG spray. He is asymptomatic.

## 2010-09-06 NOTE — Progress Notes (Signed)
    [name] Date of Birth: 02-27-1930   History of Present Illness: [name] is seen back today for a 3 week check. He is seen for Dr. [place]. I added Norvasc to his regimen 3 weeks ago. He got the prescription filled but did not take it. He doesn't really know why. He doesn't think he can find it either. His blood pressure remains up at home. He has no complaint.  Current Outpatient Prescriptions on File Prior to Visit  Medication Sig Dispense Refill  . aspirin 81 MG tablet Take 81 mg by mouth daily.        Marland Kitchen atorvastatin (LIPITOR) 40 MG tablet Take 40 mg by mouth daily.        . metoprolol succinate (TOPROL-XL) 25 MG 24 hr tablet Take 25 mg by mouth daily.        . ramipril (ALTACE) 10 MG capsule Take 1 capsule (10 mg total) by mouth daily.  90 capsule  6  . selenium 50 MCG TABS Take 200 mcg by mouth daily.        . nitroGLYCERIN (NITROLINGUAL) 0.4 MG/SPRAY spray Place 1 spray under the tongue every 5 (five) minutes as needed for chest pain.  12 g  3  . DISCONTD: amLODipine (NORVASC) 5 MG tablet Take 1 tablet (5 mg total) by mouth daily.  30 tablet  11    No Known Allergies  Past Medical History  Diagnosis Date  . Coronary artery disease     s/p CABG x 5 in 2010  . Hypertension   . Hyperlipidemia   . History of prostate cancer     s/p radioactive seed implant  . S/P CABG x 05 August 2008    Past Surgical History  Procedure Date  . Radioactive seed implant   . Coronary artery bypass graft 08/2008    x5. LIMA GRAFT TO LAD, SAPHENOUS VEIN GRAFT TO THE DIAGONAL , SAPHENOUS VEIN GRAFT TO THE FIRST OBTUSE MARGINAL VESSEL, AND SAPHENOUS VEIN GRAFT TO THE RIGHT CORONARY VESSELS.  Marland Kitchen Shoulder surgery   . Cardiac catheterization 08/10/2008    EF 55%    History  Smoking status  . Former Smoker  . Quit date: 07/11/1970  Smokeless tobacco  . Not on file    History  Alcohol Use No    Family History  Problem Relation Age of Onset  . Heart failure Mother   . Heart failure  Brother   . Heart disease Brother     Review of Systems: The review of systems is positive for elevated blood pressure.  All other systems were reviewed and are negative.  Physical Exam: BP 168/98  Pulse 66  Ht 5' 7.5" (1.715 m)  Wt 157 lb (71.215 kg)  BMI 24.23 kg/m2 Patient is very pleasant and in no acute distress. Skin is warm and dry. Color is normal.  HEENT is unremarkable. Normocephalic/atraumatic. PERRL. Sclera are nonicteric. Neck is supple. No masses. No JVD. Lungs are clear. Cardiac exam shows a regular rate and rhythm. Abdomen is soft. Extremities are without edema. Gait and ROM are intact. No gross neurologic deficits noted.   LABORATORY DATA:   Assessment / Plan:

## 2010-09-18 ENCOUNTER — Other Ambulatory Visit: Payer: Self-pay | Admitting: Cardiology

## 2010-09-18 DIAGNOSIS — E785 Hyperlipidemia, unspecified: Secondary | ICD-10-CM

## 2010-09-18 MED ORDER — ATORVASTATIN CALCIUM 40 MG PO TABS: 40.0000 mg | ORAL_TABLET | Freq: Every day | ORAL | Status: DC

## 2010-09-18 NOTE — Telephone Encounter (Signed)
Sent to [place], advised patient

## 2010-09-18 NOTE — Telephone Encounter (Signed)
Pt  wants atorvastatin 40 mg [facility] by mail going on vacation next week needs this week

## 2010-09-20 ENCOUNTER — Other Ambulatory Visit: Payer: Self-pay | Admitting: Cardiology

## 2010-09-20 DIAGNOSIS — E785 Hyperlipidemia, unspecified: Secondary | ICD-10-CM

## 2010-09-20 MED ORDER — ATORVASTATIN CALCIUM 40 MG PO TABS: 40.0000 mg | ORAL_TABLET | Freq: Every day | ORAL | Status: DC

## 2010-09-20 NOTE — Telephone Encounter (Signed)
Called stating [facility] did not receive Lipitor that was sent in on Mon 9/17. Will resend.

## 2010-09-20 NOTE — Telephone Encounter (Signed)
Pt states needs Atorvistatin 40mg  refilled through [place].  Pt states called [facility] this Monday and they stated they haven't received it.  Please call refill into [facility].

## 2010-09-21 ENCOUNTER — Other Ambulatory Visit: Payer: Self-pay | Admitting: *Deleted

## 2010-09-21 DIAGNOSIS — E785 Hyperlipidemia, unspecified: Secondary | ICD-10-CM

## 2010-09-21 MED ORDER — ATORVASTATIN CALCIUM 40 MG PO TABS: 40.0000 mg | ORAL_TABLET | Freq: Every day | ORAL | Status: DC

## 2010-09-21 NOTE — Telephone Encounter (Signed)
Called Rx for Lipitor 40 mg to [facility] delivery; they don't receive electronic directly goes thru another medium.

## 2010-10-03 ENCOUNTER — Ambulatory Visit (INDEPENDENT_AMBULATORY_CARE_PROVIDER_SITE_OTHER): Payer: Medicare Other | Admitting: Nurse Practitioner

## 2010-10-03 ENCOUNTER — Encounter: Payer: Self-pay | Admitting: Nurse Practitioner

## 2010-10-03 DIAGNOSIS — I2510 Atherosclerotic heart disease of native coronary artery without angina pectoris: Secondary | ICD-10-CM

## 2010-10-03 DIAGNOSIS — I10 Essential (primary) hypertension: Secondary | ICD-10-CM

## 2010-10-03 NOTE — Progress Notes (Signed)
    [name] Date of Birth: 1930/02/04   History of Present Illness: [name] is seen back today for a 3 week check. He is seen for Dr. [place]. He is now on his Norvasc. Blood pressure is better at home. His readings are reviewed. He feels well and has no complaint. He has just returned from a cruise to [place].   Current Outpatient Prescriptions on File Prior to Visit  Medication Sig Dispense Refill  . amLODipine (NORVASC) 5 MG tablet Take 1 tablet (5 mg total) by mouth daily.  30 tablet  11  . aspirin 81 MG tablet Take 81 mg by mouth daily.        Marland Kitchen atorvastatin (LIPITOR) 40 MG tablet Take 1 tablet (40 mg total) by mouth daily.  90 tablet  3  . metoprolol succinate (TOPROL-XL) 25 MG 24 hr tablet Take 25 mg by mouth daily.        . nitroGLYCERIN (NITROLINGUAL) 0.4 MG/SPRAY spray Place 1 spray under the tongue every 5 (five) minutes as needed for chest pain.  12 g  3  . ramipril (ALTACE) 10 MG capsule Take 1 capsule (10 mg total) by mouth daily.  90 capsule  6  . selenium 50 MCG TABS Take 200 mcg by mouth daily.          No Known Allergies  Past Medical History  Diagnosis Date  . Coronary artery disease     s/p CABG x 5 in 2010  . Hypertension   . Hyperlipidemia   . History of prostate cancer     s/p radioactive seed implant  . S/P CABG x 05 August 2008    Past Surgical History  Procedure Date  . Radioactive seed implant   . Coronary artery bypass graft 08/2008    x5. LIMA GRAFT TO LAD, SAPHENOUS VEIN GRAFT TO THE DIAGONAL , SAPHENOUS VEIN GRAFT TO THE FIRST OBTUSE MARGINAL VESSEL, AND SAPHENOUS VEIN GRAFT TO THE RIGHT CORONARY VESSELS.  Marland Kitchen Shoulder surgery   . Cardiac catheterization 08/10/2008    EF 55%    History  Smoking status  . Former Smoker  . Quit date: 07/11/1970  Smokeless tobacco  . Not on file    History  Alcohol Use No    Family History  Problem Relation Age of Onset  . Heart failure Mother   . Heart failure Brother   . Heart disease Brother      Review of Systems: The review of systems is per the HPI. He does note a very fleeting "electrical" feeling in his chest. No exertional symptoms and nothing like his prior chest pain syndrome.  He feels good on his medicines. All other systems were reviewed and are negative.  Physical Exam: BP 140/78  Pulse 60  Ht 5' 7.5" (1.715 m)  Wt 161 lb (73.029 kg)  BMI 24.84 kg/m2 Patient is very pleasant and in no acute distress. Skin is warm and dry. Color is normal.  HEENT is unremarkable. Normocephalic/atraumatic. PERRL. Sclera are nonicteric. Neck is supple. No masses. No JVD. Lungs are clear. Cardiac exam shows a regular rate and rhythm. Abdomen is soft. Extremities are without edema. Gait and ROM are intact. No gross neurologic deficits noted.   LABORATORY DATA:   Assessment / Plan:

## 2010-10-03 NOTE — Patient Instructions (Signed)
I think you are doing well.  Your blood pressure looks better.  Stay active. We will see you back in 3 months with Dr. [place] Keep checking your blood pressure at home and bring in your readings for review.

## 2010-10-03 NOTE — Assessment & Plan Note (Signed)
Blood pressure looks better. We will continue with his current medicines. I will have him see Dr. [place] in about 3 months. He will continue to monitor his blood pressure at home. Patient is agreeable to this plan and will call if any problems develop in the interim.

## 2010-10-03 NOTE — Assessment & Plan Note (Signed)
He has had some very fleeting discomfort. Nothing like his prior chest pain syndrome. He will let us know if it gets worse. He is thinking about shoulder and foot surgery after the first of the new year and will more than likely need follow up stress testing.

## 2010-11-03 ENCOUNTER — Encounter: Payer: Self-pay | Admitting: Cardiology

## 2010-11-06 ENCOUNTER — Encounter (INDEPENDENT_AMBULATORY_CARE_PROVIDER_SITE_OTHER): Payer: Self-pay | Admitting: General Surgery

## 2010-11-06 ENCOUNTER — Ambulatory Visit (INDEPENDENT_AMBULATORY_CARE_PROVIDER_SITE_OTHER): Payer: Medicare Other | Admitting: General Surgery

## 2010-11-06 VITALS — BP 138/78 | HR 60 | Temp 97.2°F | Resp 20 | Ht 68.0 in | Wt 158.2 lb

## 2010-11-06 DIAGNOSIS — R1032 Left lower quadrant pain: Secondary | ICD-10-CM

## 2010-11-06 NOTE — Progress Notes (Signed)
Chief Complaint  Patient presents with  . New Evaluation    eval of right groin pain possible herniia     HPI [name] is a 77 y.o. male.  Referred by Dr. Collins Scotland HPI This is a 77 year old male with a history of coronary artery disease status post bypass who presents with left groin pain that has been present for a 4 weeks at this point. He has had some steady discomfort. This is worse when he is on his feet. It has been occurring for about 6 months. It is mostly when he is standing and working for a long period of time. He did work this week and he felt no pain now. He has no change in his bowel movements or any difficulty with urination. It is relieved by lying down. He does not notice a bulge at all with this. He comes in today be evaluated for possible inguinal hernia.  Past Medical History  Diagnosis Date  . Coronary artery disease     s/p CABG x 5 in 2010  . Hypertension   . Hyperlipidemia   . History of prostate cancer     s/p radioactive seed implant  . S/P CABG x 05 August 2008  . Cancer   . Groin pain     Past Surgical History  Procedure Date  . Radioactive seed implant   . Coronary artery bypass graft 08/2008    x5. LIMA GRAFT TO LAD, SAPHENOUS VEIN GRAFT TO THE DIAGONAL , SAPHENOUS VEIN GRAFT TO THE FIRST OBTUSE MARGINAL VESSEL, AND SAPHENOUS VEIN GRAFT TO THE RIGHT CORONARY VESSELS.  Marland Kitchen Shoulder surgery   . Cardiac catheterization 08/10/2008    EF 55%    Family History  Problem Relation Age of Onset  . Heart failure Mother   . Heart failure Brother   . Heart disease Brother     Social History History  Substance Use Topics  . Smoking status: Former Smoker    Quit date: 07/11/1970  . Smokeless tobacco: Never Used  . Alcohol Use: 1.2 oz/week    2 Cans of beer per week    No Known Allergies  Current Outpatient Prescriptions  Medication Sig Dispense Refill  . amLODipine (NORVASC) 5 MG tablet Take 1 tablet (5 mg total) by mouth daily.  30 tablet  11  .  aspirin 81 MG tablet Take 81 mg by mouth daily.        Marland Kitchen atorvastatin (LIPITOR) 40 MG tablet Take 1 tablet (40 mg total) by mouth daily.  90 tablet  3  . metoprolol succinate (TOPROL-XL) 25 MG 24 hr tablet Take 25 mg by mouth daily.        . metoprolol tartrate (LOPRESSOR) 25 MG tablet       . nitroGLYCERIN (NITROLINGUAL) 0.4 MG/SPRAY spray Place 1 spray under the tongue every 5 (five) minutes as needed for chest pain.  12 g  3  . ramipril (ALTACE) 10 MG capsule Take 1 capsule (10 mg total) by mouth daily.  90 capsule  6  . selenium 50 MCG TABS Take 200 mcg by mouth daily.          Review of Systems Review of Systems  Blood pressure 138/78, pulse 60, temperature 97.2 F (36.2 C), temperature source Temporal, resp. rate 20, height 5\' 8"  (1.727 m), weight 158 lb 4 oz (71.782 kg).  Physical Exam Physical Exam  Constitutional: He appears well-developed and well-nourished.  Abdominal: Soft. He exhibits no mass. A hernia is present.  Hernia confirmed positive in the right inguinal area (small asymptomatic). Hernia confirmed negative in the left inguinal area.  Lymphadenopathy:       Right: No inguinal adenopathy present.       Left: No inguinal adenopathy present.      Assessment    Left groin pain Asymptomatic RIH    Plan    I do not think he has a left internal hernia as the source of his groin pain. I discussed with him this was likely a musculoskeletal source. We discussed the findings to watch out for to see if he does develop pain. His consult note from Dr. Alda Berthold office does clearly state right inguinal pain possible hernia. He said specifically today that this was occurring on his left side. I do not feel a hernia on this side. He does have a right groin hernia which he tells me is completely asymptomatic in that he has having no symptoms from. I told him I would just follow the left side. If the right side is truly asymptomatic as he told me today I am OK following that as well.  We discussed repairing it but I think that he is asymptomatic he is watching that right now would be reasonable. We discussed the symptoms are watch for and we discussed the possibility of developing symptoms. He was happy with that and I will see him back as needed.       Briar Witherspoon 11/06/2010, 4:30 PM

## 2010-11-20 ENCOUNTER — Telehealth: Payer: Self-pay | Admitting: Cardiology

## 2010-11-20 MED ORDER — AMLODIPINE BESYLATE 5 MG PO TABS: 5.0000 mg | ORAL_TABLET | Freq: Every day | ORAL | Status: DC

## 2010-11-22 NOTE — Telephone Encounter (Signed)
Fu call Pt wants refill of amlodipine called to [facility] home delivery. He said he called on Monday and still doesn't have a response from [place] He said he is running out of pills Please let him know

## 2010-11-22 NOTE — Telephone Encounter (Signed)
RX sent in on 11/19 Pt notified

## 2011-01-17 ENCOUNTER — Ambulatory Visit
Admission: RE | Admit: 2011-01-17 | Discharge: 2011-01-17 | Disposition: A | Discharge status: Home / Self Care | Payer: Medicare Other | Source: Ambulatory Visit | Attending: Radiation Oncology | Admitting: Radiation Oncology

## 2011-01-17 ENCOUNTER — Other Ambulatory Visit: Payer: Self-pay | Admitting: Radiation Oncology

## 2011-01-17 DIAGNOSIS — C61 Malignant neoplasm of prostate: Secondary | ICD-10-CM

## 2011-01-17 LAB — PSA: PSA: 0.04 ng/mL (ref <=4.00)

## 2011-01-24 ENCOUNTER — Encounter: Payer: Self-pay | Admitting: Radiation Oncology

## 2011-01-24 ENCOUNTER — Telehealth: Payer: Self-pay | Admitting: *Deleted

## 2011-01-24 ENCOUNTER — Ambulatory Visit
Admission: RE | Admit: 2011-01-24 | Discharge: 2011-01-24 | Disposition: A | Discharge status: Home / Self Care | Payer: Medicare Other | Source: Ambulatory Visit | Attending: Radiation Oncology | Admitting: Radiation Oncology

## 2011-01-24 VITALS — BP 133/78 | HR 53 | Temp 98.8°F | Resp 18 | Wt 163.6 lb

## 2011-01-24 DIAGNOSIS — C61 Malignant neoplasm of prostate: Secondary | ICD-10-CM

## 2011-01-24 NOTE — Progress Notes (Signed)
Patient presents to the clinic today accompanied by his wife for a follow up appointment with Dr. Dayton Scrape. Patient is alert and oriented to person, place, and time. No distress noted. Steady gait noted. Pleasant affect noted. Patient denies pain at this time. Patient reports a scheduled follow up appointment with Dr. [place], cardiologist, on January 30, 2011. Patient reports that he has a calcium deposit behind his great toe on the left foot. Patient reports that he averages getting up once per night to void. Patient denies hematuria or burning with urination. Patient reports a strong stream of urine. Patient denies headache, nausea, vomiting, or diarrhea. Patient denies weight loss. Patient reports eating and sleeping without difficulty. Patient reports that he last saw Dr. Yehuda Budd a few months ago. Reported all findings to Dr. Dayton Scrape.

## 2011-01-24 NOTE — Progress Notes (Signed)
Followup note:  The patient returns today approximately 6 years following completion of IMRT in the management of his recurrent carcinoma prostate treated by by initial cryotherapy in [facility].  He is doing well from a GU and GI standpoint. His most recent PSA from 01/17/2011 was still undetectable at 0.04. His primary care physician is Dr. Herb English. He no longer sees Dr. Logan English.  Physical examination: Rectal the prostate gland/bed is flat and is without masses, induration or nodularity.  Impression: No evidence for recurrent disease. There is a high likelihood that [name] is cured.  Plan: I offered to have Dr. Collins English perform his annual PSA followup, but he would like to return here annually for his PSA determination and exam. I'll have him return in one year after repeating his PSA in January of 2014.

## 2011-01-30 ENCOUNTER — Encounter: Payer: Self-pay | Admitting: Cardiology

## 2011-01-30 ENCOUNTER — Ambulatory Visit (INDEPENDENT_AMBULATORY_CARE_PROVIDER_SITE_OTHER): Payer: Medicare Other | Admitting: Cardiology

## 2011-01-30 VITALS — BP 128/62 | HR 60 | Ht 68.0 in | Wt 163.8 lb

## 2011-01-30 DIAGNOSIS — I2510 Atherosclerotic heart disease of native coronary artery without angina pectoris: Secondary | ICD-10-CM

## 2011-01-30 DIAGNOSIS — I10 Essential (primary) hypertension: Secondary | ICD-10-CM

## 2011-01-30 DIAGNOSIS — E785 Hyperlipidemia, unspecified: Secondary | ICD-10-CM

## 2011-01-30 MED ORDER — AMLODIPINE BESYLATE 10 MG PO TABS: 10.0000 mg | ORAL_TABLET | Freq: Every day | ORAL | Status: DC

## 2011-01-30 NOTE — Assessment & Plan Note (Signed)
Lipid levels are well controlled on Lipitor.

## 2011-01-30 NOTE — Assessment & Plan Note (Signed)
He is status post CABG in August of 2010. He remains asymptomatic. Continue risk factor modification.

## 2011-01-30 NOTE — Assessment & Plan Note (Signed)
Blood pressure reading at home are still not optimal. His blood pressure reading is fairly good here today in his cuff does correlate well. I recommended increasing his amlodipine to 10 mg per day and since his blood pressure readings are usually worse in the morning I recommended he take this at bedtime. He will continue with his other medication and we will follow up again in 6 months.

## 2011-01-30 NOTE — Patient Instructions (Signed)
Increase amlodipine to 10 mg daily and take at night.  Continue your other medications.  I will see you again in 6 months.

## 2011-01-30 NOTE — Progress Notes (Signed)
    [name] Date of Birth: Jul 13, 1930   History of Present Illness: [name] is seen back today for a followup visit. Blood pressure is better at home but still elevated. Systolic readings in the morning typically run in the mid 150s. He has no complaints and is tolerating his medication well.  Current Outpatient Prescriptions on File Prior to Visit  Medication Sig Dispense Refill  . aspirin 81 MG tablet Take 81 mg by mouth daily.        Marland Kitchen atorvastatin (LIPITOR) 40 MG tablet Take 1 tablet (40 mg total) by mouth daily.  90 tablet  3  . metoprolol succinate (TOPROL-XL) 25 MG 24 hr tablet Take 25 mg by mouth daily.        . metoprolol tartrate (LOPRESSOR) 25 MG tablet       . nitroGLYCERIN (NITROLINGUAL) 0.4 MG/SPRAY spray Place 1 spray under the tongue every 5 (five) minutes as needed for chest pain.  12 g  3  . ramipril (ALTACE) 10 MG capsule Take 1 capsule (10 mg total) by mouth daily.  90 capsule  6  . selenium 50 MCG TABS Take 200 mcg by mouth daily.         No Known Allergies  Past Medical History  Diagnosis Date  . Coronary artery disease     s/p CABG x 5 in 2010  . Hypertension   . Hyperlipidemia   . History of prostate cancer     s/p radioactive seed implant  . S/P CABG x 05 August 2008  . Cancer   . Groin pain     Past Surgical History  Procedure Date  . Radioactive seed implant   . Coronary artery bypass graft 08/2008    x5. LIMA GRAFT TO LAD, SAPHENOUS VEIN GRAFT TO THE DIAGONAL , SAPHENOUS VEIN GRAFT TO THE FIRST OBTUSE MARGINAL VESSEL, AND SAPHENOUS VEIN GRAFT TO THE RIGHT CORONARY VESSELS.  Marland Kitchen Shoulder surgery   . Cardiac catheterization 08/10/2008    EF 55%    History  Smoking status  . Former Smoker  . Quit date: 07/11/1970  Smokeless tobacco  . Never Used    History  Alcohol Use  . 1.2 oz/week  . 2 Cans of beer per week    Family History  Problem Relation Age of Onset  . Heart failure Mother   . Heart failure Brother   . Heart disease  Brother     Review of Systems: The review of systems is per the HPI.  He feels good on his medicines. All other systems were reviewed and are negative.  Physical Exam: BP 128/62  Pulse 60  Ht 5\' 8"  (1.727 m)  Wt 163 lb 12.8 oz (74.299 kg)  BMI 24.91 kg/m2 Patient is very pleasant and in no acute distress. Skin is warm and dry. Color is normal.  HEENT is unremarkable. Normocephalic/atraumatic. PERRL. Sclera are nonicteric. Neck is supple. No masses. No JVD. Lungs are clear. Cardiac exam shows a regular rate and rhythm. Abdomen is soft. Extremities are without edema. Gait and ROM are intact. No gross neurologic deficits noted.   LABORATORY DATA:   Assessment / Plan:

## 2011-02-23 NOTE — Telephone Encounter (Signed)
xxxx 

## 2011-04-23 ENCOUNTER — Telehealth: Payer: Self-pay | Admitting: Cardiology

## 2011-04-23 NOTE — Telephone Encounter (Signed)
All Cardiac [facility]ed [facility] @ [phone]/Sarah  04/23/11/KM

## 2011-07-11 ENCOUNTER — Ambulatory Visit (INDEPENDENT_AMBULATORY_CARE_PROVIDER_SITE_OTHER): Payer: Medicare Other | Admitting: Cardiology

## 2011-07-11 ENCOUNTER — Encounter: Payer: Self-pay | Admitting: Cardiology

## 2011-07-11 VITALS — BP 130/70 | HR 55 | Ht 68.0 in | Wt 163.0 lb

## 2011-07-11 DIAGNOSIS — I10 Essential (primary) hypertension: Secondary | ICD-10-CM

## 2011-07-11 DIAGNOSIS — E785 Hyperlipidemia, unspecified: Secondary | ICD-10-CM

## 2011-07-11 DIAGNOSIS — I2510 Atherosclerotic heart disease of native coronary artery without angina pectoris: Secondary | ICD-10-CM

## 2011-07-11 NOTE — Progress Notes (Signed)
    [name] Date of Birth: 12/28/1930   History of Present Illness: [name] is seen back today for a followup visit. He reports that he is doing very well. He brings an extensive diary of his blood pressure readings which have been excellent. He states that he feels "perfect". He did have surgery on his left foot earlier this year for some calcium deposits on his great toe. He is now back able to exercise.  Current Outpatient Prescriptions on File Prior to Visit  Medication Sig Dispense Refill  . amLODipine (NORVASC) 10 MG tablet Take 1 tablet (10 mg total) by mouth daily.  90 tablet  11  . aspirin 81 MG tablet Take 81 mg by mouth daily.        Marland Kitchen atorvastatin (LIPITOR) 40 MG tablet Take 1 tablet (40 mg total) by mouth daily.  90 tablet  3  . metoprolol tartrate (LOPRESSOR) 25 MG tablet Take 25 mg by mouth daily.       . nitroGLYCERIN (NITROLINGUAL) 0.4 MG/SPRAY spray Place 1 spray under the tongue every 5 (five) minutes as needed for chest pain.  12 g  3  . ramipril (ALTACE) 10 MG capsule Take 1 capsule (10 mg total) by mouth daily.  90 capsule  6    No Known Allergies  Past Medical History  Diagnosis Date  . Coronary artery disease     s/p CABG x 5 in 2010  . Hypertension   . Hyperlipidemia   . History of prostate cancer     s/p radioactive seed implant  . S/P CABG x 05 August 2008  . Cancer   . Groin pain     Past Surgical History  Procedure Date  . Radioactive seed implant   . Coronary artery bypass graft 08/2008    x5. LIMA GRAFT TO LAD, SAPHENOUS VEIN GRAFT TO THE DIAGONAL , SAPHENOUS VEIN GRAFT TO THE FIRST OBTUSE MARGINAL VESSEL, AND SAPHENOUS VEIN GRAFT TO THE RIGHT CORONARY VESSELS.  Marland Kitchen Shoulder surgery   . Cardiac catheterization 08/10/2008    EF 55%    History  Smoking status  . Former Smoker  . Quit date: 07/11/1970  Smokeless tobacco  . Never Used    History  Alcohol Use  . 1.2 oz/week  . 2 Cans of beer per week    Family History  Problem  Relation Age of Onset  . Heart failure Mother   . Heart failure Brother   . Heart disease Brother     Review of Systems: The review of systems is per the HPI.  He feels good on his medicines. All other systems were reviewed and are negative.  Physical Exam: BP 130/70  Pulse 55  Ht 5\' 8"  (1.727 m)  Wt 163 lb (73.936 kg)  BMI 24.78 kg/m2 Patient is very pleasant and in no acute distress. Skin is warm and dry. Color is normal.  HEENT is unremarkable. Normocephalic/atraumatic. PERRL. Sclera are nonicteric. Neck is supple. No masses. No JVD. Lungs are clear. Cardiac exam shows a regular rate and rhythm. Abdomen is soft. Extremities are without edema. Gait and ROM are intact. No gross neurologic deficits noted.   LABORATORY DATA: From November 2012 his total cholesterols 118, HDL 60, triglycerides 106, LDL 37. Chemistries were normal.  Assessment / Plan:

## 2011-07-11 NOTE — Assessment & Plan Note (Signed)
He continues to do very well status post CABG in August of 2010. No recurrent anginal symptoms. We'll continue with risk factor modification. Continue on aspirin, Toprol, and ACE inhibitor.

## 2011-07-11 NOTE — Assessment & Plan Note (Signed)
Excellent control on Lipitor.

## 2011-07-11 NOTE — Patient Instructions (Signed)
Continue your current therapy  I will see you again in 6 months.   

## 2011-07-11 NOTE — Assessment & Plan Note (Signed)
Excellent control on current medication. 

## 2011-08-22 ENCOUNTER — Other Ambulatory Visit: Payer: Self-pay | Admitting: *Deleted

## 2011-08-22 DIAGNOSIS — I10 Essential (primary) hypertension: Secondary | ICD-10-CM

## 2011-08-22 MED ORDER — RAMIPRIL 10 MG PO CAPS: 10.0000 mg | ORAL_CAPSULE | Freq: Every day | ORAL | Status: DC

## 2011-09-13 ENCOUNTER — Other Ambulatory Visit: Payer: Self-pay | Admitting: Cardiology

## 2011-09-13 DIAGNOSIS — E785 Hyperlipidemia, unspecified: Secondary | ICD-10-CM

## 2011-09-13 MED ORDER — ATORVASTATIN CALCIUM 40 MG PO TABS: 40.0000 mg | ORAL_TABLET | Freq: Every day | ORAL | Status: DC

## 2011-09-13 NOTE — Telephone Encounter (Signed)
Needs refill and Pharm confirmed

## 2011-09-24 ENCOUNTER — Telehealth: Payer: Self-pay | Admitting: Cardiology

## 2011-09-24 ENCOUNTER — Other Ambulatory Visit: Payer: Self-pay | Admitting: Cardiology

## 2011-09-24 DIAGNOSIS — E785 Hyperlipidemia, unspecified: Secondary | ICD-10-CM

## 2011-09-24 MED ORDER — ATORVASTATIN CALCIUM 40 MG PO TABS: 40.0000 mg | ORAL_TABLET | Freq: Every day | ORAL | Status: DC

## 2011-09-24 NOTE — Telephone Encounter (Signed)
Called [name], spoke with Abby, she stated the order on 09/13/2011 was never called in, she transferred me to the pharmacist.  Spoke with the pharmacist, Thurston Hole gave verbal refill authorization for atorvastatin (LIPITOR) 40 MG 1-qd, qty 90, with 3 refills.  [name], CMA  Called the pt and informed him of the error and that I had corrected it, I also informed him that Thurston Hole stated that it would take about 7 days for him to receive the atorvastatin.  I asked if he would like a 15 day supply sent to a local pharmacy, he refused and stated he should be ok.  [name], CMA

## 2011-09-24 NOTE — Telephone Encounter (Signed)
Error

## 2012-01-16 ENCOUNTER — Ambulatory Visit
Admission: RE | Admit: 2012-01-16 | Discharge: 2012-01-16 | Disposition: A | Discharge status: Home / Self Care | Payer: Medicare Other | Source: Ambulatory Visit | Attending: Radiation Oncology | Admitting: Radiation Oncology

## 2012-01-16 DIAGNOSIS — C61 Malignant neoplasm of prostate: Secondary | ICD-10-CM

## 2012-01-23 ENCOUNTER — Encounter: Payer: Self-pay | Admitting: Radiation Oncology

## 2012-01-23 ENCOUNTER — Ambulatory Visit
Admission: RE | Admit: 2012-01-23 | Discharge: 2012-01-23 | Disposition: A | Discharge status: Home / Self Care | Payer: Medicare Other | Source: Ambulatory Visit | Attending: Radiation Oncology | Admitting: Radiation Oncology

## 2012-01-23 VITALS — BP 143/75 | HR 54 | Temp 98.0°F | Resp 18 | Wt 167.2 lb

## 2012-01-23 DIAGNOSIS — C61 Malignant neoplasm of prostate: Secondary | ICD-10-CM

## 2012-01-23 HISTORY — DX: Malignant neoplasm of prostate: C61

## 2012-01-23 NOTE — Progress Notes (Signed)
CC: Dr. Dewain Penning  Followup note: [name] returns today approximately 7 years following completion of IMRT in the management of his recurrent carcinoma the prostate, initially treated with cryotherapy in [place]. He continues to do well from a GU and GI standpoint. His recent PSA from January 16 was 0.03. He tells me he sees his primary care physician, Dr. Herb Grays at least annually.  Physical examination: Wt Readings from Last 3 Encounters:  01/23/12 167 lb 3.2 oz (75.841 kg)  07/11/11 163 lb (73.936 kg)  01/30/11 163 lb 12.8 oz (74.299 kg)   Temp Readings from Last 3 Encounters:  01/23/12 98 F (36.7 C) Oral  01/24/11 98.8 F (37.1 C) Oral  11/06/10 97.2 F (36.2 C) Temporal   BP Readings from Last 3 Encounters:  01/23/12 143/75  07/11/11 130/70  01/30/11 128/62   Pulse Readings from Last 3 Encounters:  01/23/12 54  07/11/11 55  01/30/11 60   Rectal examination: Digital examination reveals a normal size prostate without focal induration or nodularity.  Laboratory data: Lab Results  Component Value Date   PSA 0.03 01/16/2012   PSA 0.04 01/17/2011   PSA 0.03 01/11/2010   Impression: Satisfactory progress. There is a high likelihood that [name] is cure.  Plan: I told the patient that he can simply have a PSA determination when he visits Dr. Dewain Penning for his annual physical. I more than happy to see him should the need arise.

## 2012-01-23 NOTE — Progress Notes (Signed)
Patient presents to the clinic today accompanied by his wife for a follow up appointment with Dr. Dayton Scrape. Patient is alert and oriented to person, place, and time. No distress noted. Steady gait noted. Pleasant affect noted. Patient denies pain at this time. Patient reports "i feel great." Patient reports on average he only gets up once during the night to void. Patient denies hematuria. Patient reports a normal strong urine stream. Patient denies burning with urination. Patient denies difficulty emptying bladder. Patient reports normal formed daily bowel movements denies constipation or diarrhea. Patient denies nausea, vomiting, headache or dizziness. Reported all findings to Dr. Dayton Scrape.   To follow up with cardiologist in June.

## 2012-01-30 ENCOUNTER — Telehealth: Payer: Self-pay | Admitting: Cardiology

## 2012-01-30 MED ORDER — AMLODIPINE BESYLATE 10 MG PO TABS: 10.0000 mg | ORAL_TABLET | Freq: Every day | ORAL | Status: DC

## 2012-01-30 NOTE — Telephone Encounter (Signed)
New problem:   Amlodipinde 10 mg.   [employer] home delivery

## 2012-08-29 ENCOUNTER — Other Ambulatory Visit: Payer: Self-pay | Admitting: *Deleted

## 2012-08-29 DIAGNOSIS — I10 Essential (primary) hypertension: Secondary | ICD-10-CM

## 2012-08-29 MED ORDER — RAMIPRIL 10 MG PO CAPS: 10.0000 mg | ORAL_CAPSULE | Freq: Every day | ORAL | Status: DC

## 2012-09-04 ENCOUNTER — Encounter: Payer: Self-pay | Admitting: Cardiology

## 2012-09-04 ENCOUNTER — Ambulatory Visit (INDEPENDENT_AMBULATORY_CARE_PROVIDER_SITE_OTHER): Payer: Medicare Other | Admitting: Cardiology

## 2012-09-04 VITALS — BP 102/64 | HR 52 | Ht 68.0 in | Wt 156.0 lb

## 2012-09-04 DIAGNOSIS — I2510 Atherosclerotic heart disease of native coronary artery without angina pectoris: Secondary | ICD-10-CM

## 2012-09-04 DIAGNOSIS — I10 Essential (primary) hypertension: Secondary | ICD-10-CM

## 2012-09-04 DIAGNOSIS — R079 Chest pain, unspecified: Secondary | ICD-10-CM

## 2012-09-04 DIAGNOSIS — E785 Hyperlipidemia, unspecified: Secondary | ICD-10-CM

## 2012-09-04 MED ORDER — NITROGLYCERIN 0.4 MG/SPRAY TL SOLN: 1.0000 | Status: AC | PRN

## 2012-09-04 MED ORDER — AMLODIPINE BESYLATE 5 MG PO TABS: 5.0000 mg | ORAL_TABLET | Freq: Every day | ORAL | Status: DC

## 2012-09-04 MED ORDER — ATORVASTATIN CALCIUM 40 MG PO TABS: 40.0000 mg | ORAL_TABLET | Freq: Every day | ORAL | Status: DC

## 2012-09-04 NOTE — Progress Notes (Signed)
[name] Date of Birth: April 20, 1930   History of Present Illness: [name] is seen back today for a followup visit. He has a history of coronary disease and is status post coronary bypass surgery in August 2010. He reports that this summer after working hard in his yard his blood pressure will be low in the 90s systolic. His blood pressure has been in excellent control. He also complains of feeling a little more fatigued and tends to fall asleep easily after activity. He has had occasional symptoms of discomfort in his mid chest that he states feels like a light burn and is fairly continuous. He is very active and just built a new deck for his house.  Current Outpatient Prescriptions on File Prior to Visit  Medication Sig Dispense Refill  . aspirin 81 MG tablet Take 81 mg by mouth daily.        . metoprolol tartrate (LOPRESSOR) 25 MG tablet Take 25 mg by mouth daily.       . ramipril (ALTACE) 10 MG capsule Take 1 capsule (10 mg total) by mouth daily.  90 capsule  0   No current facility-administered medications on file prior to visit.    No Known Allergies  Past Medical History  Diagnosis Date  . Coronary artery disease     s/p CABG x 5 in 2010  . Hypertension   . Hyperlipidemia   . History of prostate cancer     s/p radioactive seed implant  . S/P CABG x 05 August 2008  . Groin pain   . Prostate cancer     Past Surgical History  Procedure Laterality Date  . Radioactive seed implant    . Coronary artery bypass graft  08/2008    x5. LIMA GRAFT TO LAD, SAPHENOUS VEIN GRAFT TO THE DIAGONAL , SAPHENOUS VEIN GRAFT TO THE FIRST OBTUSE MARGINAL VESSEL, AND SAPHENOUS VEIN GRAFT TO THE RIGHT CORONARY VESSELS.  Marland Kitchen Shoulder surgery    . Cardiac catheterization  08/10/2008    EF 55%    History  Smoking status  . Former Smoker  . Quit date: 07/11/1970  Smokeless tobacco  . Never Used    History  Alcohol Use  . 1.2 oz/week  . 2 Cans of beer per week    Family History   Problem Relation Age of Onset  . Heart failure Mother   . Heart failure Brother   . Heart disease Brother     Review of Systems: The review of systems is per the HPI.  He feels good on his medicines. All other systems were reviewed and are negative.  Physical Exam: BP 102/64  Pulse 52  Ht 5\' 8"  (1.727 m)  Wt 156 lb (70.761 kg)  BMI 23.73 kg/m2 Patient is very pleasant and in no acute distress. Skin is warm and dry. Color is normal.  HEENT is unremarkable. Normocephalic/atraumatic. PERRL. Sclera are nonicteric. Neck is supple. No masses. No JVD. Lungs are clear. Cardiac exam shows a regular rate and rhythm. Abdomen is soft. Extremities are without edema. Gait and ROM are intact. No gross neurologic deficits noted.   LABORATORY DATA: ECG today demonstrates normal sinus rhythm with a normal ECG. Rate is 52 beats per minute.  Assessment / Plan: 1. Coronary disease status post CABG. He has some symptoms of atypical chest pain. I recommended reevaluation with a stress echo. We will continue on aspirin, metoprolol, and ACE inhibitor. 2. Hypertension. Blood pressure is actually ranging low. I recommended reducing  amlodipine to 5 mg daily. 3. Hyperlipidemia-on Lipitor.

## 2012-09-04 NOTE — Patient Instructions (Signed)
Reduce amlodipine to 5 mg daily  We will schedule you for a stress Echo.

## 2012-09-12 ENCOUNTER — Other Ambulatory Visit: Payer: Self-pay | Admitting: Cardiology

## 2012-09-26 ENCOUNTER — Ambulatory Visit (HOSPITAL_BASED_OUTPATIENT_CLINIC_OR_DEPARTMENT_OTHER): Payer: Medicare Other

## 2012-09-26 ENCOUNTER — Ambulatory Visit (HOSPITAL_COMMUNITY): Payer: Medicare Other | Attending: Cardiology | Admitting: Radiology

## 2012-09-26 DIAGNOSIS — Z951 Presence of aortocoronary bypass graft: Secondary | ICD-10-CM | POA: Insufficient documentation

## 2012-09-26 DIAGNOSIS — I10 Essential (primary) hypertension: Secondary | ICD-10-CM

## 2012-09-26 DIAGNOSIS — I2510 Atherosclerotic heart disease of native coronary artery without angina pectoris: Secondary | ICD-10-CM | POA: Insufficient documentation

## 2012-09-26 DIAGNOSIS — R0989 Other specified symptoms and signs involving the circulatory and respiratory systems: Secondary | ICD-10-CM

## 2012-09-26 DIAGNOSIS — R079 Chest pain, unspecified: Secondary | ICD-10-CM

## 2012-09-26 DIAGNOSIS — E785 Hyperlipidemia, unspecified: Secondary | ICD-10-CM

## 2012-09-26 DIAGNOSIS — R072 Precordial pain: Secondary | ICD-10-CM

## 2012-09-26 NOTE — Progress Notes (Signed)
Stress Echocardiogram performed.  

## 2012-12-18 ENCOUNTER — Other Ambulatory Visit: Payer: Self-pay | Admitting: *Deleted

## 2012-12-18 DIAGNOSIS — I10 Essential (primary) hypertension: Secondary | ICD-10-CM

## 2012-12-18 MED ORDER — RAMIPRIL 10 MG PO CAPS: 10.0000 mg | ORAL_CAPSULE | Freq: Every day | ORAL | Status: DC

## 2013-01-05 ENCOUNTER — Ambulatory Visit: Payer: Medicare Other | Admitting: Cardiology

## 2013-02-11 ENCOUNTER — Encounter (INDEPENDENT_AMBULATORY_CARE_PROVIDER_SITE_OTHER): Payer: Self-pay | Admitting: General Surgery

## 2013-02-11 ENCOUNTER — Ambulatory Visit (INDEPENDENT_AMBULATORY_CARE_PROVIDER_SITE_OTHER): Payer: Medicare Other | Admitting: General Surgery

## 2013-02-11 VITALS — BP 126/78 | HR 76 | Temp 98.0°F | Resp 18 | Ht 69.0 in | Wt 156.0 lb

## 2013-02-11 DIAGNOSIS — K4090 Unilateral inguinal hernia, without obstruction or gangrene, not specified as recurrent: Secondary | ICD-10-CM

## 2013-02-11 NOTE — Patient Instructions (Signed)

## 2013-02-12 NOTE — Progress Notes (Signed)
Subjective:     Patient ID: [name], male   DOB: 03-21-1930, 80 y.o.   MRN: [id number]  HPI This is a 80 year old male with a history of coronary artery disease status post bypass who presented 2 years go with left groin pain. He has had some steady discomfort. This is worse when he is on his feet. It has now been occurring since then. It is mostly when he is standing and working for a long period of time. He did work this week and he felt no pain now. He has no change in his bowel movements or any difficulty with urination. It is relieved by lying down. He does not notice a bulge at all with this still  I thought he had a small right sided IH last time I saw him but he still has no problems at all with this side.  He has had colonscopy in last 5 years.  Describes pain in left groin and also into his abdominal wall lateral to umbilicus.   Review of Systems     Objective:   Physical Exam  Vitals reviewed. Constitutional: He appears well-developed and well-nourished.  Abdominal: Soft. Bowel sounds are normal. There is no tenderness. A hernia (I think he has small bilateral hernias that are nontender, these are difficult to palpate) is present. Hernia confirmed positive in the right inguinal area and confirmed positive in the left inguinal area.       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 He is lifting up to 100 lbs working in his yard and never notices any problems with that.  We discussed surgery and observation.  He would like to continue observation for now.  I told him there is very small risk of incarceration and emergency surgery.  I think reasonable and he will call me back as needed

## 2013-03-05 ENCOUNTER — Ambulatory Visit (INDEPENDENT_AMBULATORY_CARE_PROVIDER_SITE_OTHER): Payer: Medicare Other | Admitting: Cardiology

## 2013-03-05 ENCOUNTER — Encounter: Payer: Self-pay | Admitting: Cardiology

## 2013-03-05 VITALS — BP 139/86 | HR 55 | Ht 69.0 in | Wt 157.0 lb

## 2013-03-05 DIAGNOSIS — I2510 Atherosclerotic heart disease of native coronary artery without angina pectoris: Secondary | ICD-10-CM

## 2013-03-05 DIAGNOSIS — E785 Hyperlipidemia, unspecified: Secondary | ICD-10-CM

## 2013-03-05 DIAGNOSIS — I10 Essential (primary) hypertension: Secondary | ICD-10-CM

## 2013-03-05 NOTE — Progress Notes (Signed)
[name] Date of Birth: 11/10/1930   History of Present Illness: [name] is seen back today for a followup visit. He has a history of coronary disease and is status post coronary bypass surgery in August 2010. He had a normal stress Echo in September 2014. He has been monitoring his BP closely. For the most part his readings have been good with an occasional elevated reading into the 150s. No chest pain, SOB, or dizziness. He remains active.  Current Outpatient Prescriptions on File Prior to Visit  Medication Sig Dispense Refill  . aspirin 81 MG tablet Take 81 mg by mouth daily.        Marland Kitchen atorvastatin (LIPITOR) 40 MG tablet TAKE 1 TABLET BY MOUTH DAILY  90 tablet  2  . metoprolol tartrate (LOPRESSOR) 25 MG tablet Take 25 mg by mouth daily.       . nitroGLYCERIN (NITROLINGUAL) 0.4 MG/SPRAY spray Place 1 spray under the tongue every 5 (five) minutes as needed for chest pain.  12 g  3  . ramipril (ALTACE) 10 MG capsule Take 1 capsule (10 mg total) by mouth daily.  90 capsule  1   No current facility-administered medications on file prior to visit.    No Known Allergies  Past Medical History  Diagnosis Date  . Coronary artery disease     s/p CABG x 5 in 2010  . Hypertension   . Hyperlipidemia   . History of prostate cancer     s/p radioactive seed implant  . S/P CABG x 05 August 2008  . Groin pain   . Prostate cancer     Past Surgical History  Procedure Laterality Date  . Radioactive seed implant    . Coronary artery bypass graft  08/2008    x5. LIMA GRAFT TO LAD, SAPHENOUS VEIN GRAFT TO THE DIAGONAL , SAPHENOUS VEIN GRAFT TO THE FIRST OBTUSE MARGINAL VESSEL, AND SAPHENOUS VEIN GRAFT TO THE RIGHT CORONARY VESSELS.  Marland Kitchen Shoulder surgery    . Cardiac catheterization  08/10/2008    EF 55%    History  Smoking status  . Former Smoker  . Quit date: 07/11/1970  Smokeless tobacco  . Never Used    History  Alcohol Use  . 1.2 oz/week  . 2 Cans of beer per week    Family  History  Problem Relation Age of Onset  . Heart failure Mother   . Heart failure Brother   . Heart disease Brother     Review of Systems: The review of systems is per the HPI.  He feels good on his medicines. All other systems were reviewed and are negative.  Physical Exam: BP 139/86  Pulse 55  Ht 5\' 9"  (1.753 m)  Wt 157 lb (71.215 kg)  BMI 23.17 kg/m2 Patient is very pleasant and in no acute distress. Skin is warm and dry. Color is normal.  HEENT is unremarkable. Normocephalic/atraumatic. PERRL. Sclera are nonicteric. Neck is supple. No masses. No JVD. Lungs are clear. Cardiac exam shows a regular rate and rhythm. Abdomen is soft. Extremities are without edema. Gait and ROM are intact. No gross neurologic deficits noted.   LABORATORY DATA: Stress Echocardiography  Patient: [name] MR #: [id number] Study Date: 09/26/2012 Gender: M Age: 80 Height: 172.7cm Weight: 70.9kg BSA: 1.85m^2 Pt. Status: Room:  ORDERING [place], [place], [place], Provider W PERFORMING Zacarias Pontes, [facility] SONOGRAPHER Victorio Palm, RDCS cc:  ------------------------------------------------------------  ------------------------------------------------------------ Indications: CAD of native vessels  414.01. Chest pain 786.51.  ------------------------------------------------------------ History: PMH: Acquired from the patient and from the patient's chart. Chest pain. Fatigue. Coronary artery disease. Risk factors: Family history of coronary artery disease. Former tobacco use. Hypertension. Dyslipidemia.  ------------------------------------------------------------ Study Conclusions  Stress ECG conclusions: The stress ECG was normal except for brief run of SVT.  Impressions:  - Patient only reached 72% of maximum predicted heart rate (submaximal study). Good exercise tolerance. No ischemic changes on stress echo or stress ECG  images.  ------------------------------------------------------------ Labs, prior tests, procedures, and surgery: Catheterization (2010). EF was 55%.  Coronary artery bypass grafting. Bruce protocol. Stress echocardiography. Height: Height: 172.7cm. Height: 68in. Weight: Weight: 70.9kg. Weight: 156lb. Body mass index: BMI: 23.8kg/m^2. Body surface area: BSA: 1.71m^2. Blood pressure: 148/79. Patient status: Outpatient.  ------------------------------------------------------------  ------------------------------------------------------------ Stress protocol:  +---------------------+---+-----------+---+----------------+ Stage HR BP (mmHg) SatSymptoms  +---------------------+---+-----------+---+----------------+ Baseline 60 148/79 96%None    (102)    +---------------------+---+-----------+---+----------------+ Stage 1 73 138/66 (90)96%None  +---------------------+---+-----------+---+----------------+ Stage 2 83 151/65 (94)95%None  +---------------------+---+-----------+---+----------------+ Stage 3 93 111/75 (87)95%Dyspnea  +---------------------+---+-----------+---+----------------+ Stage 4 100--------------Moderate dyspnea +---------------------+---+-----------+---+----------------+ Immediate post stress---151/76 96%None    (101)    +---------------------+---+-----------+---+----------------+ Recovery; 1 min 87 160/54 (89)---None  +---------------------+---+-----------+---+----------------+ Recovery; 2 min 81 -----------96%None  +---------------------+---+-----------+---+----------------+ Recovery; 3 min 74 --------------None  +---------------------+---+-----------+---+----------------+ Recovery; 4 min 74 144/54 (84)---None  +---------------------+---+-----------+---+----------------+ Recovery; 5 min 77 -----------96%None  +---------------------+---+-----------+---+----------------+ Late  recovery 71 --------------None  +---------------------+---+-----------+---+----------------+  ------------------------------------------------------------ Stress results: Maximal heart rate during stress was 100bpm (72% of maximal predicted heart rate). The maximal predicted heart rate was 140bpm.The target heart rate was not achieved. The heart rate response to stress was blunted. There was a normal resting blood pressure with an appropriate response to stress. The rate-pressure product for the peak heart rate and blood pressure was 13954mm Hg/min. The patient experienced no chest pain during stress.  ------------------------------------------------------------ Stress ECG: The stress ECG was normal except for brief run of SVT.  ------------------------------------------------------------ Baseline:  - LV size was normal. - LV global systolic function was normal. - Normal wall motion; no LV regional wall motion abnormalities. Peak stress:  - LV size was reduced appropriately and appropriately decreased from baseline. - LV global systolic function was vigorous and appropriately augmented from baseline. - Normal wall motion; no LV regional wall motion abnormalities.  ------------------------------------------------------------  2D measurements Normal Left ventricle LVID ED, 41.4 mm 43-52 chord, PLAX LVID ES, 34.5 mm 23-38 chord, PLAX FS, chord, 17 % >29 PLAX LVPW, ED 10.6 mm ------ IVS/LVPW 1.24 <1.3 ratio, ED Ventricular septum IVS, ED 13.1 mm ------ Aorta Root diam, 36 mm ------ ED Left atrium AP dim 43 mm ------ AP dim 2.32 cm/m^2 <2.2 index  ------------------------------------------------------------ Prepared and Electronically Authenticated by  Loralie Champagne 2014-09-26T17:52:[id number]   Assessment / Plan: 1. Coronary disease status post CABG. Asymptomatic. Normal stress Echo in September 2014. We will continue on aspirin, metoprolol, and ACE  inhibitor.  2. Hypertension. Blood pressure control is acceptable on lower dose of amlodipine. Previously he was having episodes of hypotension.  3. Hyperlipidemia-on Lipitor. Recent lab work with Dr. Modena Morrow- will request a copy.

## 2013-03-05 NOTE — Patient Instructions (Signed)
Continue your current therapy  We will request a copy of your lab work from Dr. Modena Morrow.  I will see you in 6 months.

## 2013-06-25 ENCOUNTER — Ambulatory Visit (HOSPITAL_BASED_OUTPATIENT_CLINIC_OR_DEPARTMENT_OTHER): Payer: Medicare Other | Admitting: Hematology and Oncology

## 2013-06-25 ENCOUNTER — Ambulatory Visit: Payer: Medicare Other

## 2013-06-25 ENCOUNTER — Encounter: Payer: Self-pay | Admitting: Hematology and Oncology

## 2013-06-25 VITALS — BP 128/65 | HR 48 | Temp 97.9°F | Resp 20 | Ht 69.0 in | Wt 156.0 lb

## 2013-06-25 DIAGNOSIS — R197 Diarrhea, unspecified: Secondary | ICD-10-CM

## 2013-06-25 DIAGNOSIS — M949 Disorder of cartilage, unspecified: Secondary | ICD-10-CM

## 2013-06-25 DIAGNOSIS — C61 Malignant neoplasm of prostate: Secondary | ICD-10-CM

## 2013-06-25 DIAGNOSIS — M7918 Myalgia, other site: Secondary | ICD-10-CM

## 2013-06-25 DIAGNOSIS — Z8546 Personal history of malignant neoplasm of prostate: Secondary | ICD-10-CM

## 2013-06-25 DIAGNOSIS — D696 Thrombocytopenia, unspecified: Secondary | ICD-10-CM

## 2013-06-25 DIAGNOSIS — M899 Disorder of bone, unspecified: Secondary | ICD-10-CM

## 2013-06-25 NOTE — Progress Notes (Signed)
[name] CONSULT NOTE  Patient Care Team: Florina Ou, MD as PCP - General (Family Medicine) Heath Lark, MD as Consulting Physician (Hematology and Oncology)  CHIEF COMPLAINTS/PURPOSE OF CONSULTATION:  Chronic thrombocytopenia  HISTORY OF PRESENTING ILLNESS:  [name] 80 y.o. male is here because of thrombocytopenia.  He was found to have abnormal CBC from 2010. His platelet count went down to as low as 64,000 postoperatively to within normal limits. He denies recent bruising/bleeding, such as spontaneous epistaxis, hematuria, melena or hematochezia The patient denies history of liver disease or recent new medications He denies prior blood or platelet transfusions The patient drinks alcohol on a daily basis for many years. Recently, he cut down to one drink a day.  He also had diagnosis of prostate cancer treated with radioactive seed implant.  MEDICAL HISTORY:  Past Medical History  Diagnosis Date  . Coronary artery disease     s/p CABG x 5 in 2010  . Hypertension   . Hyperlipidemia   . History of prostate cancer     s/p radioactive seed implant  . S/P CABG x 05 August 2008  . Groin pain   . Prostate cancer     SURGICAL HISTORY: Past Surgical History  Procedure Laterality Date  . Radioactive seed implant    . Coronary artery bypass graft  08/2008    x5. LIMA GRAFT TO LAD, SAPHENOUS VEIN GRAFT TO THE DIAGONAL , SAPHENOUS VEIN GRAFT TO THE FIRST OBTUSE MARGINAL VESSEL, AND SAPHENOUS VEIN GRAFT TO THE RIGHT CORONARY VESSELS.  Marland Kitchen Shoulder surgery    . Cardiac catheterization  08/10/2008    EF 55%    SOCIAL HISTORY: History   Social History  . Marital Status: Married    Spouse Name: N/A    Number of Children: 2  . Years of Education: N/A   Occupational History  . industrial Press photographer     retired   Social History Main Topics  . Smoking status: Former Smoker    Quit date: 07/11/1970  . Smokeless tobacco: Never Used  . Alcohol Use: 4.2 oz/week    7 Glasses of wine per week  . Drug Use: No  . Sexual Activity: Not on file   Other Topics Concern  . Not on file   Social History Narrative  . No narrative on file    FAMILY HISTORY: Family History  Problem Relation Age of Onset  . Heart failure Mother   . Heart failure Brother   . Heart disease Brother     ALLERGIES:  has No Known Allergies.  MEDICATIONS:  Current Outpatient Prescriptions  Medication Sig Dispense Refill  . amLODipine (NORVASC) 5 MG tablet Take 5 mg by mouth 2 (two) times daily.      Marland Kitchen aspirin 81 MG tablet Take 81 mg by mouth daily.        Marland Kitchen atorvastatin (LIPITOR) 40 MG tablet TAKE 1 TABLET BY MOUTH DAILY  90 tablet  2  . metoprolol tartrate (LOPRESSOR) 25 MG tablet Take 25 mg by mouth daily.       . ramipril (ALTACE) 10 MG capsule Take 1 capsule (10 mg total) by mouth daily.  90 capsule  1  . nitroGLYCERIN (NITROLINGUAL) 0.4 MG/SPRAY spray Place 1 spray under the tongue every 5 (five) minutes as needed for chest pain.  12 g  3   No current facility-administered medications for this visit.    REVIEW OF SYSTEMS:   Constitutional: Denies fevers, chills or abnormal night  sweats Eyes: Denies blurriness of vision, double vision or watery eyes Ears, nose, mouth, throat, and face: Denies mucositis or sore throat Respiratory: Denies cough, dyspnea or wheezes Cardiovascular: Denies palpitation, chest discomfort or lower extremity swelling Gastrointestinal:  Denies nausea, heartburn. He has loose bowel movement for the last 2 years. Skin: Denies abnormal skin rashes Lymphatics: Denies new lymphadenopathy or easy bruising Neurological:Denies numbness, tingling or new weaknesses Behavioral/Psych: Mood is stable, no new changes  All other systems were reviewed with the patient and are negative.  PHYSICAL EXAMINATION: ECOG PERFORMANCE STATUS: 0 - Asymptomatic  Filed Vitals:   06/25/13 1122  BP: 128/65  Pulse: 48  Temp: 97.9 F (36.6 C)  Resp: 20   Filed  Weights   06/25/13 1122  Weight: 156 lb (70.761 kg)    GENERAL:alert, no distress and comfortable SKIN: skin color, texture, turgor are normal, no rashes or significant lesions EYES: normal, conjunctiva are pink and non-injected, sclera clear OROPHARYNX:no exudate, no erythema and lips, buccal mucosa, and tongue normal  NECK: supple, thyroid normal size, non-tender, without nodularity LYMPH:  no palpable lymphadenopathy in the cervical, axillary or inguinal LUNGS: clear to auscultation and percussion with normal breathing effort HEART: regular rate & rhythm and no murmurs and no lower extremity edema. Well-healed surgical scar ABDOMEN:abdomen soft, non-tender and normal bowel sounds Musculoskeletal:no cyanosis of digits and no clubbing  PSYCH: alert & oriented x 3 with fluent speech NEURO: no focal motor/sensory deficits  LABORATORY DATA:  I have reviewed the data as listed  ASSESSMENT & PLAN Thrombocytopenia, unspecified Up and down over the past 5 years. I suspect this could be due to alcoholic drinks. Another possibility would be an undiagnosed ITP but the patient is not symptomatic. I discussed with the patient and his wife in great length about further testing and he agreed to defer for now. I recommend he continue followup CBC with his primary care physician on a yearly basis. If his platelet count dropped to less than 100,000, I will see him back and initiate further workup. He agrees.  Prostate cancer Stage is unknown. His last PSA were normal. I recommend continue followup with his PCP with yearly PSA.  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 Also told the patient to try avoiding dairy products for several months to see if this could be due to lactose intolerance.  Musculoskeletal pain I recommend vitamin D supplement.

## 2013-06-25 NOTE — Progress Notes (Signed)
Checked in new patient with no financial issues and has not been out of the country. He has appt card.

## 2013-06-25 NOTE — Assessment & Plan Note (Signed)
Stage is unknown. His last PSA were normal. I recommend continue followup with his PCP with yearly PSA.

## 2013-06-25 NOTE — Assessment & Plan Note (Signed)
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 Also told the patient to try avoiding dairy products for several months to see if this could be due to lactose intolerance.

## 2013-06-25 NOTE — Assessment & Plan Note (Signed)
Up and down over the past 5 years. I suspect this could be due to alcoholic drinks. Another possibility would be an undiagnosed ITP but the patient is not symptomatic. I discussed with the patient and his wife in great length about further testing and he agreed to defer for now. I recommend he continue followup CBC with his primary care physician on a yearly basis. If his platelet count dropped to less than 100,000, I will see him back and initiate further workup. He agrees.

## 2013-06-25 NOTE — Assessment & Plan Note (Signed)
I recommend vitamin D supplement 

## 2013-09-02 ENCOUNTER — Ambulatory Visit (INDEPENDENT_AMBULATORY_CARE_PROVIDER_SITE_OTHER): Payer: Medicare Other | Admitting: Cardiology

## 2013-09-02 ENCOUNTER — Telehealth: Payer: Self-pay | Admitting: Cardiology

## 2013-09-02 ENCOUNTER — Encounter: Payer: Self-pay | Admitting: Cardiology

## 2013-09-02 VITALS — BP 124/68 | HR 47 | Ht 69.0 in | Wt 153.0 lb

## 2013-09-02 DIAGNOSIS — I498 Other specified cardiac arrhythmias: Secondary | ICD-10-CM

## 2013-09-02 DIAGNOSIS — I10 Essential (primary) hypertension: Secondary | ICD-10-CM

## 2013-09-02 DIAGNOSIS — I2510 Atherosclerotic heart disease of native coronary artery without angina pectoris: Secondary | ICD-10-CM

## 2013-09-02 DIAGNOSIS — R001 Bradycardia, unspecified: Secondary | ICD-10-CM

## 2013-09-02 DIAGNOSIS — E785 Hyperlipidemia, unspecified: Secondary | ICD-10-CM

## 2013-09-02 NOTE — Telephone Encounter (Signed)
Returned call to patient he stated he takes Areds 2 one twice a day.Will let Dr.Jordan know.

## 2013-09-02 NOTE — Patient Instructions (Signed)
Stop taking metoprolol  Continue your other medication  Monitor your blood pressure. Call if it stays high.  I will see you in 6 months.

## 2013-09-02 NOTE — Telephone Encounter (Signed)
Please call,he wants to give you the name of the medicine he did not know this morning when he was here.

## 2013-09-02 NOTE — Progress Notes (Signed)
[name] Date of Birth: [birth year]   History of Present Illness: [name] is seen back today for a followup visit. He has a history of coronary disease and is status post coronary bypass surgery in August 2010. He had a normal stress Echo in September 2014. He has been monitoring his BP closely and readings indicate good control. HR typically low to mid 50s. No chest pain, SOB, or dizziness. He remains active.  Current Outpatient Prescriptions on File Prior to Visit  Medication Sig Dispense Refill  . amLODipine (NORVASC) 5 MG tablet Take 5 mg by mouth 2 (two) times daily.      Marland Kitchen aspirin 81 MG tablet Take 81 mg by mouth daily.        Marland Kitchen atorvastatin (LIPITOR) 40 MG tablet TAKE 1 TABLET BY MOUTH DAILY  90 tablet  2  . nitroGLYCERIN (NITROLINGUAL) 0.4 MG/SPRAY spray Place 1 spray under the tongue every 5 (five) minutes as needed for chest pain.  12 g  3  . ramipril (ALTACE) 10 MG capsule Take 1 capsule (10 mg total) by mouth daily.  90 capsule  1   No current facility-administered medications on file prior to visit.    No Known Allergies  Past Medical History  Diagnosis Date  . Coronary artery disease     s/p CABG x 5 in 2010  . Hypertension   . Hyperlipidemia   . History of prostate cancer     s/p radioactive seed implant  . S/P CABG x 05 August 2008  . Groin pain   . Prostate cancer     Past Surgical History  Procedure Laterality Date  . Radioactive seed implant    . Coronary artery bypass graft  08/2008    x5. LIMA GRAFT TO LAD, SAPHENOUS VEIN GRAFT TO THE DIAGONAL , SAPHENOUS VEIN GRAFT TO THE FIRST OBTUSE MARGINAL VESSEL, AND SAPHENOUS VEIN GRAFT TO THE RIGHT CORONARY VESSELS.  Marland Kitchen Shoulder surgery    . Cardiac catheterization  08/10/2008    EF 55%    History  Smoking status  . Former Smoker  . Quit date: 07/11/1970  Smokeless tobacco  . Never Used    History  Alcohol Use  . 4.2 oz/week  . 7 Glasses of wine per week    Family History  Problem Relation  Age of Onset  . Heart failure Mother   . Heart failure Brother   . Heart disease Brother     Review of Systems: The review of systems is per the HPI.  All other systems were reviewed and are negative.  Physical Exam: BP 124/68  Pulse 47  Ht 5\' 9"  (1.753 m)  Wt 153 lb (69.4 kg)  BMI 22.58 kg/m2 Patient is very pleasant and in no acute distress. Skin is warm and dry. Color is normal.  HEENT is unremarkable. Normocephalic/atraumatic. PERRL. Sclera are nonicteric. Neck is supple. No masses. No JVD. Lungs are clear. Cardiac exam shows a regular rate and rhythm. Abdomen is soft. Extremities are without edema. Gait and ROM are intact. No gross neurologic deficits noted.   LABORATORY DATA: Ecg: marked sinus brady with rate 47 bpm. Otherwise normal.  Assessment / Plan: 1. Coronary disease status post CABG. Asymptomatic. Normal stress Echo in September 2014. We will continue on aspirin, statin, and ACE inhibitor.  2. Hypertension. Blood pressure control is acceptable.  3. Hyperlipidemia-on Lipitor. Last lab work with Dr. Modena Morrow was excellent.  4. Marked sinus bradycardia. Last stress test also showed a  blunted HR with peak of 100. Recommend stopping metoprolol.

## 2013-09-11 ENCOUNTER — Telehealth: Payer: Self-pay | Admitting: Cardiology

## 2013-09-11 MED ORDER — AMLODIPINE BESYLATE 5 MG PO TABS: 5.0000 mg | ORAL_TABLET | Freq: Two times a day (BID) | ORAL | Status: AC

## 2013-09-11 NOTE — Telephone Encounter (Signed)
Pt need a new prescription for his Amlodipine 5 mg #90 and refills. Please call to [facility].

## 2013-09-11 NOTE — Telephone Encounter (Signed)
Returned call to patient he stated he needed 90 day refill for amlodipine.Stated he use to take 10 mg but would like 5 mg tablet he takes twice a day.Refill sent to pharmacy.

## 2013-09-11 NOTE — Telephone Encounter (Signed)
Should this patient be on the amlodipine qd or bid? Please advise. Thanks, MI

## 2013-11-10 ENCOUNTER — Telehealth: Payer: Self-pay | Admitting: Cardiology

## 2013-11-11 NOTE — Telephone Encounter (Signed)
Closed encounter °

## 2013-11-12 ENCOUNTER — Telehealth: Payer: Self-pay | Admitting: Cardiology

## 2013-11-13 NOTE — Telephone Encounter (Signed)
Closed enounter °

## 2014-02-23 ENCOUNTER — Ambulatory Visit (INDEPENDENT_AMBULATORY_CARE_PROVIDER_SITE_OTHER): Payer: Medicare Other | Admitting: Cardiology

## 2014-02-23 ENCOUNTER — Encounter: Payer: Self-pay | Admitting: Cardiology

## 2014-02-23 VITALS — BP 124/68 | HR 72 | Ht 69.0 in | Wt 157.1 lb

## 2014-02-23 DIAGNOSIS — I10 Essential (primary) hypertension: Secondary | ICD-10-CM

## 2014-02-23 DIAGNOSIS — E785 Hyperlipidemia, unspecified: Secondary | ICD-10-CM

## 2014-02-23 DIAGNOSIS — I25810 Atherosclerosis of coronary artery bypass graft(s) without angina pectoris: Secondary | ICD-10-CM

## 2014-02-23 NOTE — Progress Notes (Signed)
[name] Date of Birth: March 13, 1930   History of Present Illness: [name] is seen for follow up of CAD. He has a history of coronary disease and is status post coronary bypass surgery in August 2010. He had a normal stress Echo in September 2014. He has been monitoring his BP closely and readings indicate good control. On his last visit he had marked sinus bradycardia. His beta blocker was stopped and since then HR is normal and BP remains under good control. He denies any chest pain or SOB. Energy level is good. He does heavy yard work without difficulty.  Current Outpatient Prescriptions on File Prior to Visit  Medication Sig Dispense Refill  . amLODipine (NORVASC) 5 MG tablet Take 1 tablet (5 mg total) by mouth 2 (two) times daily. 180 tablet 3  . aspirin 81 MG tablet Take 81 mg by mouth daily.      Marland Kitchen atorvastatin (LIPITOR) 40 MG tablet TAKE 1 TABLET BY MOUTH DAILY 90 tablet 2  . cholecalciferol (VITAMIN D) 1000 UNITS tablet Take 1,000 Units by mouth daily.    . Multiple Vitamins-Minerals (PRESERVISION AREDS 2) CAPS Take 1 capsule by mouth 2 (two) times daily. 60 capsule 6  . nitroGLYCERIN (NITROLINGUAL) 0.4 MG/SPRAY spray Place 1 spray under the tongue every 5 (five) minutes as needed for chest pain. 12 g 3   No current facility-administered medications on file prior to visit.    No Known Allergies  Past Medical History  Diagnosis Date  . Coronary artery disease     s/p CABG x 5 in 2010  . Hypertension   . Hyperlipidemia   . History of prostate cancer     s/p radioactive seed implant  . S/P CABG x 05 August 2008  . Groin pain   . Prostate cancer     Past Surgical History  Procedure Laterality Date  . Radioactive seed implant    . Coronary artery bypass graft  08/2008    x5. LIMA GRAFT TO LAD, SAPHENOUS VEIN GRAFT TO THE DIAGONAL , SAPHENOUS VEIN GRAFT TO THE FIRST OBTUSE MARGINAL VESSEL, AND SAPHENOUS VEIN GRAFT TO THE RIGHT CORONARY VESSELS.  Marland Kitchen Shoulder surgery     . Cardiac catheterization  08/10/2008    EF 55%    History  Smoking status  . Former Smoker  . Quit date: 07/11/1970  Smokeless tobacco  . Never Used    History  Alcohol Use  . 4.2 oz/week  . 7 Glasses of wine per week    Family History  Problem Relation Age of Onset  . Heart failure Mother   . Heart failure Brother   . Heart disease Brother     Review of Systems: The review of systems is per the HPI.  All other systems were reviewed and are negative.  Physical Exam: BP 124/68 mmHg  Pulse 72  Ht 5\' 9"  (1.753 m)  Wt 157 lb 2 oz (71.271 kg)  BMI 23.19 kg/m2 Patient is very pleasant and in no acute distress. Skin is warm and dry. Color is normal.  HEENT is unremarkable. Normocephalic/atraumatic. PERRL. Sclera are nonicteric. Neck is supple. No masses. No JVD. Lungs are clear. Cardiac exam shows a regular rate and rhythm. Abdomen is soft. Extremities are without edema. Gait and ROM are intact. No gross neurologic deficits noted.   LABORATORY DATA:   Assessment / Plan: 1. Coronary disease status post CABG. Asymptomatic. Normal stress Echo in September 2014. We will continue on aspirin, statin, and  ACE inhibitor.  2. Hypertension. Blood pressure control is acceptable.  3. Hyperlipidemia-on Lipitor. To have complete lab work with primary care this next month.  4. Marked sinus bradycardia. Resolved off beta blocker.

## 2014-02-23 NOTE — Patient Instructions (Signed)
Continue your current therapy  I will see you in one year   

## 2014-03-04 ENCOUNTER — Encounter: Payer: Self-pay | Admitting: Cardiology

## 2014-06-18 ENCOUNTER — Encounter: Payer: Self-pay | Admitting: Cardiology

## 2014-07-02 DEATH — deceased
